# Patient Record
Sex: Female | Born: 1975
Health system: Southern US, Community
[De-identification: ages and names within clinical notes are randomized; demographics above are authoritative.]

## PROBLEM LIST (undated history)

## (undated) DIAGNOSIS — Z789 Other specified health status: Secondary | ICD-10-CM

## (undated) DIAGNOSIS — D649 Anemia, unspecified: Secondary | ICD-10-CM

## (undated) DIAGNOSIS — J302 Other seasonal allergic rhinitis: Secondary | ICD-10-CM

## (undated) HISTORY — DX: Anemia, unspecified: D64.9

## (undated) HISTORY — PX: NO PAST SURGERIES: SHX2092

## (undated) HISTORY — DX: Other specified health status: Z78.9

## (undated) HISTORY — DX: Other seasonal allergic rhinitis: J30.2

---

## 2018-12-14 ENCOUNTER — Ambulatory Visit: Payer: Self-pay | Admitting: Family Medicine

## 2018-12-17 ENCOUNTER — Ambulatory Visit: Payer: Self-pay | Admitting: Family Medicine

## 2019-01-18 ENCOUNTER — Encounter: Payer: Self-pay | Admitting: Family Medicine

## 2019-01-18 ENCOUNTER — Ambulatory Visit (INDEPENDENT_AMBULATORY_CARE_PROVIDER_SITE_OTHER): Payer: 59 | Admitting: Family Medicine

## 2019-01-18 ENCOUNTER — Other Ambulatory Visit: Payer: Self-pay

## 2019-01-18 DIAGNOSIS — S46819A Strain of other muscles, fascia and tendons at shoulder and upper arm level, unspecified arm, initial encounter: Secondary | ICD-10-CM

## 2019-01-18 NOTE — Progress Notes (Addendum)
Virtual Visit via Video Note  I connected with patient on 01/14/19 at  1:15 PM EDT by a video enabled telemedicine application and verified that I am speaking with the correct person using two identifiers.   I discussed the limitations of evaluation and management by telemedicine and the availability of in person appointments. The patient expressed understanding and agreed to proceed.  History of Present Illness: NP, [employer] insurance.  5 years ago for pregnancy. 05/2013, screened for HIV. Hx of gest DM, no issues following preg. Vanessa Daugherty is 43 yo.   +hx of FM or intermittent shoulder/neck pain. B/l, does yoga and uses heat. Not terrible right now. No numbness or tingling.   Observations/Objective: No conversational dyspnea Age appropriate judgment and insight Nml affect and mood   Assessment and Plan: Strain of trapezius muscle, unspecified laterality, initial encounter  Stretches/exercises, heat, Tylenol prn. Standing offer for PT.   Follow Up Instructions: At earliest convenience for CPE   I discussed the assessment and treatment plan with the patient. The patient was provided an opportunity to ask questions and all were answered. The patient agreed with the plan and demonstrated an understanding of the instructions.   The patient was advised to call back or seek an in-person evaluation if the symptoms worsen or if the condition fails to improve as anticipated.   [place], DO

## 2019-04-26 ENCOUNTER — Encounter: Payer: Self-pay | Admitting: Family Medicine

## 2019-05-31 ENCOUNTER — Other Ambulatory Visit: Payer: Self-pay

## 2019-06-01 ENCOUNTER — Ambulatory Visit (INDEPENDENT_AMBULATORY_CARE_PROVIDER_SITE_OTHER): Payer: 59 | Admitting: Family Medicine

## 2019-06-01 ENCOUNTER — Encounter: Payer: Self-pay | Admitting: Family Medicine

## 2019-06-01 VITALS — BP 102/68 | HR 73 | Temp 97.8°F | Ht 64.0 in | Wt 132.0 lb

## 2019-06-01 DIAGNOSIS — Z0000 Encounter for general adult medical examination without abnormal findings: Secondary | ICD-10-CM | POA: Diagnosis not present

## 2019-06-01 DIAGNOSIS — Z1239 Encounter for other screening for malignant neoplasm of breast: Secondary | ICD-10-CM

## 2019-06-01 LAB — LIPID PANEL
Cholesterol: 209 mg/dL — ABNORMAL HIGH (ref 0–200)
HDL: 45.9 mg/dL (ref >39.00)
LDL Cholesterol: 132 mg/dL — ABNORMAL HIGH (ref 0–99)
NonHDL: 163.26
Total CHOL/HDL Ratio: 5
Triglycerides: 157 mg/dL — ABNORMAL HIGH (ref 0.0–149.0)
VLDL: 31.4 mg/dL (ref 0.0–40.0)

## 2019-06-01 LAB — CBC
HCT: 37 % (ref 36.0–46.0)
Hemoglobin: 12.2 g/dL (ref 12.0–15.0)
MCHC: 33 g/dL (ref 30.0–36.0)
MCV: 88.8 fl (ref 78.0–100.0)
Platelets: 223 10*3/uL (ref 150.0–400.0)
RBC: 4.17 Mil/uL (ref 3.87–5.11)
RDW: 13.2 % (ref 11.5–15.5)
WBC: 5.7 10*3/uL (ref 4.0–10.5)

## 2019-06-01 LAB — COMPREHENSIVE METABOLIC PANEL
ALT: 27 U/L (ref 0–35)
AST: 25 U/L (ref 0–37)
Albumin: 4.6 g/dL (ref 3.5–5.2)
Alkaline Phosphatase: 56 U/L (ref 39–117)
BUN: 7 mg/dL (ref 6–23)
CO2: 28 mEq/L (ref 19–32)
Calcium: 9.5 mg/dL (ref 8.4–10.5)
Chloride: 103 mEq/L (ref 96–112)
Creatinine, Ser: 0.59 mg/dL (ref 0.40–1.20)
GFR: 111.6 mL/min (ref >60.00)
Glucose, Bld: 88 mg/dL (ref 70–99)
Potassium: 4.6 mEq/L (ref 3.5–5.1)
Sodium: 137 mEq/L (ref 135–145)
Total Bilirubin: 0.9 mg/dL (ref 0.2–1.2)
Total Protein: 7.2 g/dL (ref 6.0–8.3)

## 2019-06-01 LAB — TSH: TSH: 2.03 u[IU]/mL (ref 0.35–4.50)

## 2019-06-01 LAB — HEMOGLOBIN A1C: Hgb A1c MFr Bld: 5.6 % (ref 4.6–6.5)

## 2019-06-01 LAB — T4, FREE: Free T4: 0.85 ng/dL (ref 0.60–1.60)

## 2019-06-01 NOTE — Progress Notes (Signed)
Chief Complaint  Patient presents with  . Annual Exam     Well Woman Vanessa Daugherty is here for a complete physical.   Her last physical was >1 year ago.  Current diet: in general, a "good' diet. Current exercise: pushups, running, walking, cycling, yoga. Weight is stable and she denies daytime fatigue. Seatbelt? Yes  Health Maintenance Pap/HPV- Due  Mammogram- No Tetanus- Yes HIV screening- Yes  Past Medical History:  Diagnosis Date  . No known health problems      Past Surgical History:  Procedure Laterality Date  . NO PAST SURGERIES      Medications  Takes no meds routinely.    Allergies No Known Allergies  Review of Systems: Constitutional:  no unexpected weight changes Eye:  no recent significant change in vision Ear/Nose/Mouth/Throat:  Ears:  no tinnitus or vertigo and no recent change in hearing Nose/Mouth/Throat:  no complaints of nasal congestion, no sore throat Cardiovascular: no chest pain Respiratory:  no cough and no shortness of breath Gastrointestinal:  no abdominal pain, no change in bowel habits GU:  Female: negative for dysuria or pelvic pain Musculoskeletal/Extremities: +interminewttent neck pain; otherwise no pain of the joints Integumentary (Skin/Breast):  no abnormal skin lesions reported Neurologic:  no headaches Endocrine:  denies fatigue Hematologic/Lymphatic:  No areas of easy bleeding  Exam BP 102/68 (BP Location: Left Arm, Patient Position: Sitting, Cuff Size: Normal)   Pulse 73   Temp 97.8 F (36.6 C) (Temporal)   Ht 5\' 4"  (1.626 m)   Wt 132 lb (59.9 kg)   SpO2 98%   BMI 22.66 kg/m  General:  well developed, well nourished, in no apparent distress Skin:  no significant moles, warts, or growths Head:  no masses, lesions, or tenderness Eyes:  pupils equal and round, sclera anicteric without injection Ears:  canals without lesions, TMs shiny without retraction, no obvious effusion, no erythema Nose:  nares patent, septum  midline, mucosa normal, and no drainage or sinus tenderness Throat/Pharynx:  lips and gingiva without lesion; tongue and uvula midline; non-inflamed pharynx; no exudates or postnasal drainage Neck: neck supple without adenopathy, thyromegaly, or masses Lungs:  clear to auscultation, breath sounds equal bilaterally, no respiratory distress Cardio:  regular rate and rhythm, no bruits, no LE edema Abdomen:  abdomen soft, nontender; bowel sounds normal; no masses or organomegaly Genital: Defer to GYN Musculoskeletal: +ttp over b/l traps; otherwise symmetrical muscle groups noted without atrophy or deformity Extremities:  no clubbing, cyanosis, or edema, no deformities, no skin discoloration Neuro:  gait normal; deep tendon reflexes normal and symmetric Psych: well oriented with normal range of affect and appropriate judgment/insight  Assessment and Plan  Well adult exam - Plan: CBC, Comprehensive metabolic panel, Lipid panel, TSH, T4, free, Hemoglobin A1c  Breast cancer screening - Plan: MM DIGITAL SCREENING BILATERAL   Well 43 y.o. female. Counseled on diet and exercise. Other orders as above. Follow up in 1 yr or prn. The patient voiced understanding and agreement to the plan.  [place], DO 06/01/19 2:07 PM

## 2019-06-01 NOTE — Patient Instructions (Addendum)
Give [place] 2-3 business days to get the results of your labs back.   Keep the diet clean and stay active.  Call [facility] [place] at [facility] at [phone] for an appointment.  They are located at [address], [place], [place], [address] (right across the hall from our office).  I recommend getting the flu shot in mid October. This suggestion would change if the CDC comes out with a different recommendation.   Let us know if you need anything.  [email or web address]  Trapezius stretches/exercises Do exercises exactly as told by your health care provider and adjust them as directed. It is normal to feel mild stretching, pulling, tightness, or discomfort as you do these exercises, but you should stop right away if you feel sudden pain or your pain gets worse.  Stretching and range of motion exercises These exercises warm up your muscles and joints and improve the movement and flexibility of your shoulder. These exercises can also help to relieve pain, numbness, and tingling. If you are unable to do any of the following for any reason, do not further attempt to do it.   Exercise A: Flexion, standing    1. Stand and hold a broomstick, a cane, or a similar object. Place your hands a little more than shoulder-width apart on the object. Your left / right hand should be palm-up, and your other hand should be palm-down. 2. Push the stick to raise your left / right arm out to your side and then over your head. Use your other hand to help move the stick. Stop when you feel a stretch in your shoulder, or when you reach the angle that is recommended by your health care provider. ? Avoid shrugging your shoulder while you raise your arm. Keep your shoulder blade tucked down toward your spine. 3. Hold for 30 seconds. 4. Slowly return to the starting position. Repeat 2 times. Complete this exercise 3 times per week.  Exercise B: Abduction, supine     1. Lie on your back and hold a broomstick, a cane, or a similar object. Place your hands a little more than shoulder-width apart on the object. Your left / right hand should be palm-up, and your other hand should be palm-down. 2. Push the stick to raise your left / right arm out to your side and then over your head. Use your other hand to help move the stick. Stop when you feel a stretch in your shoulder, or when you reach the angle that is recommended by your health care provider. ? Avoid shrugging your shoulder while you raise your arm. Keep your shoulder blade tucked down toward your spine. 3. Hold for 30 seconds. 4. Slowly return to the starting position. Repeat 2 times. Complete this exercise 3 times per week.  Exercise C: Flexion, active-assisted    1. Lie on your back. You may bend your knees for comfort. 2. Hold a broomstick, a cane, or a similar object. Place your hands about shoulder-width apart on the object. Your palms should face toward your feet. 3. Raise the stick and move your arms over your head and behind your head, toward the floor. Use your healthy arm to help your left / right arm move farther. Stop when you feel a gentle stretch in your shoulder, or when you reach the angle where your health care provider tells you to stop. 4. Hold for 30 seconds. 5. Slowly return to the starting position. Repeat 2 times.  Complete this exercise 3 times per week.  Exercise D: External rotation and abduction    1. Stand in a door frame with one of your feet slightly in front of the other. This is called a staggered stance. 2. Choose one of the following positions as told by your health care provider: ? Place your hands and forearms on the door frame above your head. ? Place your hands and forearms on the door frame at the height of your head. ? Place your hands on the door frame at the height of your elbows. 3. Slowly move your weight onto your front foot until you feel a stretch  across your chest and in the front of your shoulders. Keep your head and chest upright and keep your abdominal muscles tight. 4. Hold for 30 seconds. 5. To release the stretch, shift your weight to your back foot. Repeat 2 times. Complete this stretch 3 times per week.  Strengthening exercises These exercises build strength and endurance in your shoulder. Endurance is the ability to use your muscles for a long time, even after your muscles get tired. Exercise E: Scapular depression and adduction  1. Sit on a stable chair. Support your arms in front of you with pillows, armrests, or a tabletop. Keep your elbows in line with the sides of your body. 2. Gently move your shoulder blades down toward your middle back. Relax the muscles on the tops of your shoulders and in the back of your neck. 3. Hold for 3 seconds. 4. Slowly release the tension and relax your muscles completely before doing this exercise again. Repeat for a total of 10 repetitions. 5. After you have practiced this exercise, try doing the exercise without the arm support. Then, try the exercise while standing instead of sitting. Repeat 2 times. Complete this exercise 3 times per week.  Exercise F: Shoulder abduction, isometric    1. Stand or sit about 4-6 inches (10-15 cm) from a wall with your left / right side facing the wall. 2. Bend your left / right elbow and gently press your elbow against the wall. 3. Increase the pressure slowly until you are pressing as hard as you can without shrugging your shoulder. 4. Hold for 3 seconds. 5. Slowly release the tension and relax your muscles completely. Repeat for a total of 10 repetitions. Repeat 2 times. Complete this exercise 3 times per week.  Exercise G: Shoulder flexion, isometric    1. Stand or sit about 4-6 inches (10-15 cm) away from a wall with your left / right side facing the wall. 2. Keep your left / right elbow straight and gently press the top of your fist against  the wall. Increase the pressure slowly until you are pressing as hard as you can without shrugging your shoulder. 3. Hold for 10-15 seconds. 4. Slowly release the tension and relax your muscles completely. Repeat for a total of 10 repetitions. Repeat 2 times. Complete this exercise 3 times per week.  Exercise H: Internal rotation    1. Sit in a stable chair without armrests, or stand. Secure an exercise band at your left / right side, at elbow height. 2. Place a soft object, such as a folded towel or a small pillow, under your left / right upper arm so your elbow is a few inches (about 8 cm) away from your side. 3. Hold the end of the exercise band so the band stretches. 4. Keeping your elbow pressed against the soft object under your  arm, move your forearm across your body toward your abdomen. Keep your body steady so the movement is only coming from your shoulder. 5. Hold for 3 seconds. 6. Slowly return to the starting position. Repeat for a total of 10 repetitions. Repeat 2 times. Complete this exercise 3 times per week.  Exercise I: External rotation    1. Sit in a stable chair without armrests, or stand. 2. Secure an exercise band at your left / right side, at elbow height. 3. Place a soft object, such as a folded towel or a small pillow, under your left / right upper arm so your elbow is a few inches (about 8 cm) away from your side. 4. Hold the end of the exercise band so the band stretches. 5. Keeping your elbow pressed against the soft object under your arm, move your forearm out, away from your abdomen. Keep your body steady so the movement is only coming from your shoulder. 6. Hold for 3 seconds. 7. Slowly return to the starting position. Repeat for a total of 10 repetitions. Repeat 2 times. Complete this exercise 3 times per week. Exercise J: Shoulder extension  1. Sit in a stable chair without armrests, or stand. Secure an exercise band to a stable object in front of you so  the band is at shoulder height. 2. Hold one end of the exercise band in each hand. Your palms should face each other. 3. Straighten your elbows and lift your hands up to shoulder height. 4. Step back, away from the secured end of the exercise band, until the band stretches. 5. Squeeze your shoulder blades together and pull your hands down to the sides of your thighs. Stop when your hands are straight down by your sides. Do not let your hands go behind your body. 6. Hold for 3 seconds. 7. Slowly return to the starting position. Repeat for a total of 10 repetitions. Repeat 2 times. Complete this exercise 3 times per week.  Exercise K: Shoulder extension, prone    1. Lie on your abdomen on a firm surface so your left / right arm hangs over the edge. 2. Hold a 5 lb weight in your hand so your palm faces in toward your body. Your arm should be straight. 3. Squeeze your shoulder blade down toward the middle of your back. 4. Slowly raise your arm behind you, up to the height of the surface that you are lying on. Keep your arm straight. 5. Hold for 3 seconds. 6. Slowly return to the starting position and relax your muscles. Repeat for a total of 10 repetitions. Repeat 2 times. Complete this exercise 3 times per week.   Exercise L: Horizontal abduction, prone  1. Lie on your abdomen on a firm surface so your left / right arm hangs over the edge. 2. Hold a 5 lb weight in your hand so your palm faces toward your feet. Your arm should be straight. 3. Squeeze your shoulder blade down toward the middle of your back. 4. Bend your elbow so your hand moves up, until your elbow is bent to an "L" shape (90 degrees). With your elbow bent, slowly move your forearm forward and up. Raise your hand up to the height of the surface that you are lying on. ? Your upper arm should not move, and your elbow should stay bent. ? At the top of the movement, your palm should face the floor. 5. Hold for 3  seconds. 6. Slowly return to the starting  position and relax your muscles. Repeat for a total of 10 repetitions. Repeat 2 times. Complete this exercise 3 times per week.  Exercise M: Horizontal abduction, standing  1. Sit on a stable chair, or stand. 2. Secure an exercise band to a stable object in front of you so the band is at shoulder height. 3. Hold one end of the exercise band in each hand. 4. Straighten your elbows and lift your hands straight in front of you, up to shoulder height. Your palms should face down, toward the floor. 5. Step back, away from the secured end of the exercise band, until the band stretches. 6. Move your arms out to your sides, and keep your arms straight. 7. Hold for 3 seconds. 8. Slowly return to the starting position. Repeat for a total of 10 repetitions. Repeat 2 times. Complete this exercise 3 times per week.  Exercise N: Scapular retraction and elevation  1. Sit on a stable chair, or stand. 2. Secure an exercise band to a stable object in front of you so the band is at shoulder height. 3. Hold one end of the exercise band in each hand. Your palms should face each other. 4. Sit in a stable chair without armrests, or stand. 5. Step back, away from the secured end of the exercise band, until the band stretches. 6. Squeeze your shoulder blades together and lift your hands over your head. Keep your elbows straight. 7. Hold for 3 seconds. 8. Slowly return to the starting position. Repeat for a total of 10 repetitions. Repeat 2 times. Complete this exercise 3 times per week.  This information is not intended to replace advice given to you by your health care provider. Make sure you discuss any questions you have with your health care provider. Document Released: 09/23/2005 Document Revised: 05/30/2016 Document Reviewed: 08/10/2015 Elsevier Interactive Patient Education  2017 [employer].

## 2019-06-07 ENCOUNTER — Other Ambulatory Visit: Payer: Self-pay

## 2019-06-07 ENCOUNTER — Other Ambulatory Visit: Payer: Self-pay | Admitting: Family Medicine

## 2019-06-07 DIAGNOSIS — R6889 Other general symptoms and signs: Secondary | ICD-10-CM | POA: Diagnosis not present

## 2019-06-07 DIAGNOSIS — J029 Acute pharyngitis, unspecified: Secondary | ICD-10-CM

## 2019-06-07 DIAGNOSIS — Z20822 Contact with and (suspected) exposure to covid-19: Secondary | ICD-10-CM

## 2019-06-09 LAB — NOVEL CORONAVIRUS, NAA: SARS-CoV-2, NAA: NOT DETECTED

## 2019-12-13 ENCOUNTER — Ambulatory Visit: Payer: 59 | Admitting: Obstetrics & Gynecology

## 2020-01-12 ENCOUNTER — Ambulatory Visit (INDEPENDENT_AMBULATORY_CARE_PROVIDER_SITE_OTHER): Payer: 59 | Admitting: Obstetrics & Gynecology

## 2020-01-12 ENCOUNTER — Encounter: Payer: Self-pay | Admitting: Obstetrics & Gynecology

## 2020-01-12 ENCOUNTER — Other Ambulatory Visit: Payer: Self-pay

## 2020-01-12 ENCOUNTER — Other Ambulatory Visit (HOSPITAL_COMMUNITY)
Admission: RE | Admit: 2020-01-12 | Discharge: 2020-01-12 | Disposition: A | Discharge status: Home / Self Care | Payer: 59 | Source: Ambulatory Visit | Attending: Obstetrics & Gynecology | Admitting: Obstetrics & Gynecology

## 2020-01-12 ENCOUNTER — Other Ambulatory Visit: Payer: Self-pay | Admitting: Obstetrics & Gynecology

## 2020-01-12 ENCOUNTER — Ambulatory Visit (HOSPITAL_BASED_OUTPATIENT_CLINIC_OR_DEPARTMENT_OTHER)
Admission: RE | Admit: 2020-01-12 | Discharge: 2020-01-12 | Disposition: A | Discharge status: Home / Self Care | Payer: 59 | Source: Ambulatory Visit | Attending: Obstetrics & Gynecology | Admitting: Obstetrics & Gynecology

## 2020-01-12 VITALS — BP 110/64 | HR 78 | Ht 64.0 in | Wt 133.1 lb

## 2020-01-12 DIAGNOSIS — N393 Stress incontinence (female) (male): Secondary | ICD-10-CM

## 2020-01-12 DIAGNOSIS — Z01419 Encounter for gynecological examination (general) (routine) without abnormal findings: Secondary | ICD-10-CM | POA: Insufficient documentation

## 2020-01-12 DIAGNOSIS — N814 Uterovaginal prolapse, unspecified: Secondary | ICD-10-CM

## 2020-01-12 DIAGNOSIS — Z1231 Encounter for screening mammogram for malignant neoplasm of breast: Secondary | ICD-10-CM | POA: Insufficient documentation

## 2020-01-12 NOTE — Progress Notes (Signed)
Subjective:     Vanessa Daugherty is a 44 y.o. female here for a routine exam.G2P2 s/p SVD x 2 of 7-8# babies. LMP mid Mar. Reports cycles monthly. No issues. Married and sexually active. Condoms for contraception. Reports occ incontinence with laughing. Did Kegels immediately after delivery but, only briefly.     Gynecologic History Patient's last menstrual period was 12/20/2019. Contraception: condoms Last Pap: 2016. Results were: normal Last mammogram: never had  Obstetric History OB History  Gravida Para Term Preterm AB Living  2 2 2     2   SAB TAB Ectopic Multiple Live Births          2    # Outcome Date GA Lbr Len/2nd Weight Sex Delivery Anes PTL Lv  2 Term 2014 [redacted]w[redacted]d   F Vag-Spont EPI N LIV  1 Term 2011 [redacted]w[redacted]d   F Vag-Spont EPI N LIV   The following portions of the patient's history were reviewed and updated as appropriate: allergies, current medications, past family history, past medical history, past social history, past surgical history and problem list.  Review of Systems Pertinent items are noted in HPI.    Objective:  BP 110/64   Pulse 78   Ht 5\' 4"  (1.626 m)   Wt 133 lb 1.3 oz (60.4 kg)   LMP 12/20/2019   BMI 22.84 kg/m   General Appearance:    Alert, cooperative, no distress, appears stated age  Head:    Normocephalic, without obvious abnormality, atraumatic  Eyes:    conjunctiva/corneas clear, EOM's intact, both eyes  Ears:    Normal external ear canals, both ears  Nose:   Nares normal, septum midline, mucosa normal, no drainage    or sinus tenderness  Throat:   Lips, mucosa, and tongue normal; teeth and gums normal  Neck:   Supple, symmetrical, trachea midline, no adenopathy;    thyroid:  no enlargement/tenderness/nodules  Back:     Symmetric, no curvature, ROM normal, no CVA tenderness  Lungs:     respirations unlabored  Chest Wall:    No tenderness or deformity   Heart:    Regular rate and rhythm  Breast Exam:    No tenderness, masses, or nipple  abnormality  Abdomen:     Soft, non-tender, bowel sounds active all four quadrants,    no masses, no organomegaly  Genitalia:    Normal female without lesion, discharge or tenderness   Grade I uterine prolapse with mild midline cystocele.   Extremities:   Extremities normal, atraumatic, no cyanosis or edema  Pulses:   2+ and symmetric all extremities  Skin:   Skin color, texture, turgor normal, no rashes or lesions    Assessment:    Healthy female exam.   Mild uterine prolapse and cystocele- no treatment needed at present. .  Occ stress urinary incontinence.    Plan:   F/u PAP with rHPV screening mammogram  Kegel's exercises F/u in 1 year or sooner prn  Antonios Ostrow L. Harraway-Smith, M.D., Vanessa Daugherty

## 2020-01-12 NOTE — Patient Instructions (Signed)

## 2020-01-13 LAB — CYTOLOGY - PAP
Comment: NEGATIVE
Diagnosis: NEGATIVE
High risk HPV: NEGATIVE

## 2020-05-30 ENCOUNTER — Ambulatory Visit: Payer: 59 | Attending: Internal Medicine

## 2020-05-30 DIAGNOSIS — Z23 Encounter for immunization: Secondary | ICD-10-CM

## 2020-05-30 NOTE — Progress Notes (Signed)
   Covid-19 Vaccination Clinic  Name:  Vanessa Daugherty    MRN: [id number] DOB: 1976/01/04  05/30/2020  Vanessa Daugherty was observed post Covid-19 immunization for 15 minutes without incident. She was provided with Vaccine Information Sheet and instruction to access the V-Safe system.   Vanessa Daugherty was instructed to call 911 with any severe reactions post vaccine: Marland Kitchen Difficulty breathing  . Swelling of face and throat  . A fast heartbeat  . A bad rash all over body  . Dizziness and weakness

## 2020-06-06 ENCOUNTER — Encounter: Payer: 59 | Admitting: Family Medicine

## 2020-06-09 ENCOUNTER — Ambulatory Visit (INDEPENDENT_AMBULATORY_CARE_PROVIDER_SITE_OTHER): Payer: 59 | Admitting: Family Medicine

## 2020-06-09 ENCOUNTER — Other Ambulatory Visit: Payer: Self-pay

## 2020-06-09 ENCOUNTER — Encounter: Payer: Self-pay | Admitting: Family Medicine

## 2020-06-09 ENCOUNTER — Other Ambulatory Visit: Payer: Self-pay | Admitting: Family Medicine

## 2020-06-09 VITALS — BP 104/62 | HR 65 | Temp 98.1°F | Ht 64.0 in | Wt 138.0 lb

## 2020-06-09 DIAGNOSIS — Z0000 Encounter for general adult medical examination without abnormal findings: Secondary | ICD-10-CM

## 2020-06-09 DIAGNOSIS — Z1159 Encounter for screening for other viral diseases: Secondary | ICD-10-CM

## 2020-06-09 MED ORDER — POLYSACCHARIDE IRON COMPLEX 150 MG PO CAPS: 150.0000 mg | ORAL_CAPSULE | Freq: Every day | ORAL | 5 refills | Status: DC

## 2020-06-09 MED ORDER — PANTOPRAZOLE SODIUM 40 MG PO TBEC: 40.0000 mg | DELAYED_RELEASE_TABLET | Freq: Every day | ORAL | 1 refills | Status: DC

## 2020-06-09 MED ORDER — KETOROLAC TROMETHAMINE 10 MG PO TABS: 10.0000 mg | ORAL_TABLET | Freq: Four times a day (QID) | ORAL | 2 refills | Status: DC | PRN

## 2020-06-09 MED FILL — FERREX 150 CAPSULE: 150 | 30 days supply | Qty: 30 | Fill #0

## 2020-06-09 MED FILL — PANTOPRAZOLE SOD DR 40 MG T: 40 | 60 days supply | Qty: 60 | Fill #0

## 2020-06-09 MED FILL — KETOROLAC 10 MG TABLET: 10 | 5 days supply | Qty: 20 | Fill #0

## 2020-06-09 NOTE — Progress Notes (Signed)
Chief Complaint  Patient presents with  . Annual Exam  . Abdominal Pain     Well Woman Vanessa Daugherty is here for a complete physical.   Her last physical was >1 year ago.  Current diet: in general, diet could be better. Current exercise: nothing routine. Weight is up 5 lbs and she denies fatigue out of ordinary. Seatbelt? Yes   Health Maintenance Pap/HPV- Yes Mammogram- Yes Tetanus- Yes Hep C screening- No HIV screening- Yes  Past Medical History:  Diagnosis Date  . No known health problems      Past Surgical History:  Procedure Laterality Date  . NO PAST SURGERIES      Medications  Takes no meds routinely.    Allergies No Known Allergies  Review of Systems: Constitutional:  no unexpected weight changes Eye:  no recent significant change in vision Ear/Nose/Mouth/Throat:  Ears:  no recent change in hearing Nose/Mouth/Throat:  no complaints of nasal congestion, no sore throat Cardiovascular: no chest pain Respiratory:  no shortness of breath Gastrointestinal:  +reflux GU:  Female: negative for dysuria or pelvic pain Musculoskeletal/Extremities:  No new pain of the joints Integumentary (Skin/Breast):  no abnormal skin lesions reported Neurologic:  no headaches Endocrine:  denies fatigue Hematologic/Lymphatic:  No areas of easy bleeding  Exam BP 104/62 (BP Location: Left Arm, Patient Position: Sitting, Cuff Size: Normal)   Pulse 65   Temp 98.1 F (36.7 C) (Oral)   Ht 5\' 4"  (1.626 m)   Wt 138 lb (62.6 kg)   SpO2 99%   BMI 23.69 kg/m  General:  well developed, well nourished, in no apparent distress Skin:  no significant moles, warts, or growths Head:  no masses, lesions, or tenderness Eyes:  pupils equal and round, sclera anicteric without injection Ears:  canals without lesions, TMs shiny without retraction, no obvious effusion, no erythema Nose:  nares patent, septum midline, mucosa normal, and no drainage or sinus tenderness Throat/Pharynx:  lips  and gingiva without lesion; tongue and uvula midline; non-inflamed pharynx; no exudates or postnasal drainage Neck: neck supple without adenopathy, thyromegaly, or masses Lungs:  clear to auscultation, breath sounds equal bilaterally, no respiratory distress Cardio:  regular rate and rhythm, no LE edema Abdomen:  abdomen soft, nontender; bowel sounds normal; no masses or organomegaly Genital: Defer to GYN Musculoskeletal: +ttp over b/l traps; symmetrical muscle groups noted without atrophy or deformity Extremities:  no clubbing, cyanosis, or edema, no deformities, no skin discoloration Neuro:  gait normal; deep tendon reflexes normal and symmetric Psych: well oriented with normal range of affect and appropriate judgment/insight  Assessment and Plan  Well adult exam - Plan: CBC, Comprehensive metabolic panel, TSH, T4, free, Lipid panel, IBC + Ferritin, Hemoglobin A1c, VITAMIN D 25 Hydroxy (Vit-D Deficiency, Fractures)  Encounter for hepatitis C screening test for low risk patient - Plan: Hepatitis C antibody   Well 44 y.o. female. Counseled on diet and exercise. Other orders as above. 60 d ppi trial. Consider cont it or H2 blocker afterwards. Hopefully she can stop. She probably has FM. Discussed medication (has been on a few), but she would like to avoid for now. Follow up in 1 yr or prn. The patient voiced understanding and agreement to the plan.  [place], DO 06/09/20 2:20 PM

## 2020-06-09 NOTE — Patient Instructions (Addendum)
Give [place] 2-3 business days to get the results of your labs back.   Keep the diet clean and stay active.  The only lifestyle changes that have data behind them are weight loss for the overweight/obese and elevating the head of the bed. Finding out which foods/positions are triggers is important.  Do those stretches and exercises.   Heat (pad or rice pillow in microwave) over affected area, 10-15 minutes twice daily.   Let me know if you would like to start a medicine, let me know.  Consider yoga, tai chi, weight resistance exercise.  Let us know if you need anything.

## 2020-06-10 ENCOUNTER — Other Ambulatory Visit: Payer: Self-pay | Admitting: Family Medicine

## 2020-06-10 LAB — IRON,TIBC AND FERRITIN PANEL
%SAT: 20 % (calc) (ref 16–45)
Ferritin: 18 ng/mL (ref 16–232)
Iron: 83 ug/dL (ref 40–190)
TIBC: 408 mcg/dL (calc) (ref 250–450)

## 2020-06-10 MED ORDER — VITAMIN D (ERGOCALCIFEROL) 1.25 MG (50000 UNIT) PO CAPS: 50000.0000 [IU] | ORAL_CAPSULE | ORAL | 0 refills | Status: DC

## 2020-06-13 ENCOUNTER — Other Ambulatory Visit: Payer: Self-pay | Admitting: Family Medicine

## 2020-06-13 DIAGNOSIS — E559 Vitamin D deficiency, unspecified: Secondary | ICD-10-CM

## 2020-06-13 DIAGNOSIS — E785 Hyperlipidemia, unspecified: Secondary | ICD-10-CM

## 2020-06-13 LAB — COMPREHENSIVE METABOLIC PANEL
AG Ratio: 1.5 (calc) (ref 1.0–2.5)
ALT: 17 U/L (ref 6–29)
AST: 17 U/L (ref 10–30)
Albumin: 4.4 g/dL (ref 3.6–5.1)
Alkaline phosphatase (APISO): 57 U/L (ref 31–125)
BUN/Creatinine Ratio: 10 (calc) (ref 6–22)
BUN: 6 mg/dL — ABNORMAL LOW (ref 7–25)
CO2: 26 mmol/L (ref 20–32)
Calcium: 9.5 mg/dL (ref 8.6–10.2)
Chloride: 103 mmol/L (ref 98–110)
Creat: 0.61 mg/dL (ref 0.50–1.10)
Globulin: 3 g/dL (calc) (ref 1.9–3.7)
Glucose, Bld: 74 mg/dL (ref 65–99)
Potassium: 4.1 mmol/L (ref 3.5–5.3)
Sodium: 137 mmol/L (ref 135–146)
Total Bilirubin: 0.9 mg/dL (ref 0.2–1.2)
Total Protein: 7.4 g/dL (ref 6.1–8.1)

## 2020-06-13 LAB — HEPATITIS C ANTIBODY
Hepatitis C Ab: NONREACTIVE
SIGNAL TO CUT-OFF: 0.01 (ref <1.00)

## 2020-06-13 LAB — VITAMIN D 25 HYDROXY (VIT D DEFICIENCY, FRACTURES): Vit D, 25-Hydroxy: 15 ng/mL — ABNORMAL LOW (ref 30–100)

## 2020-06-13 LAB — HEMOGLOBIN A1C
Hgb A1c MFr Bld: 5.3 % of total Hgb (ref <5.7)
Mean Plasma Glucose: 105 (calc)
eAG (mmol/L): 5.8 (calc)

## 2020-06-13 LAB — CBC
HCT: 37.8 % (ref 35.0–45.0)
Hemoglobin: 12.3 g/dL (ref 11.7–15.5)
MCH: 29.3 pg (ref 27.0–33.0)
MCHC: 32.5 g/dL (ref 32.0–36.0)
MCV: 90 fL (ref 80.0–100.0)
MPV: 10.9 fL (ref 7.5–12.5)
Platelets: 253 10*3/uL (ref 140–400)
RBC: 4.2 10*6/uL (ref 3.80–5.10)
RDW: 13.4 % (ref 11.0–15.0)
WBC: 6.4 10*3/uL (ref 3.8–10.8)

## 2020-06-13 LAB — LIPID PANEL
Cholesterol: 206 mg/dL — ABNORMAL HIGH (ref <200)
HDL: 54 mg/dL (ref >=50)
LDL Cholesterol (Calc): 120 mg/dL (calc) — ABNORMAL HIGH
Non-HDL Cholesterol (Calc): 152 mg/dL (calc) — ABNORMAL HIGH (ref <130)
Total CHOL/HDL Ratio: 3.8 (calc) (ref <5.0)
Triglycerides: 207 mg/dL — ABNORMAL HIGH (ref <150)

## 2020-06-13 LAB — TSH: TSH: 1.58 mIU/L

## 2020-06-13 LAB — T4, FREE: Free T4: 1.3 ng/dL (ref 0.8–1.8)

## 2020-06-13 MED FILL — VIT D2 1.25 MG (50,000 UNIT: 1.25 MG | 84 days supply | Qty: 12 | Fill #0

## 2020-07-05 MED FILL — FLUARIX QUADRIVALENT 0.5 ML: 0.5 | 1 days supply | Qty: 1 | Fill #0

## 2020-08-15 MED FILL — FERREX 150 CAPSULE: 150 | 30 days supply | Qty: 30 | Fill #1

## 2020-08-27 IMAGING — MG DIGITAL SCREENING BILAT W/ TOMO W/ CAD
7 series · 9 of 23 positions shown · non-contrast
Comparison: None.

CLINICAL DATA: Screening.

EXAM:
DIGITAL SCREENING BILATERAL MAMMOGRAM WITH TOMO AND CAD

[R CC synth-2D]
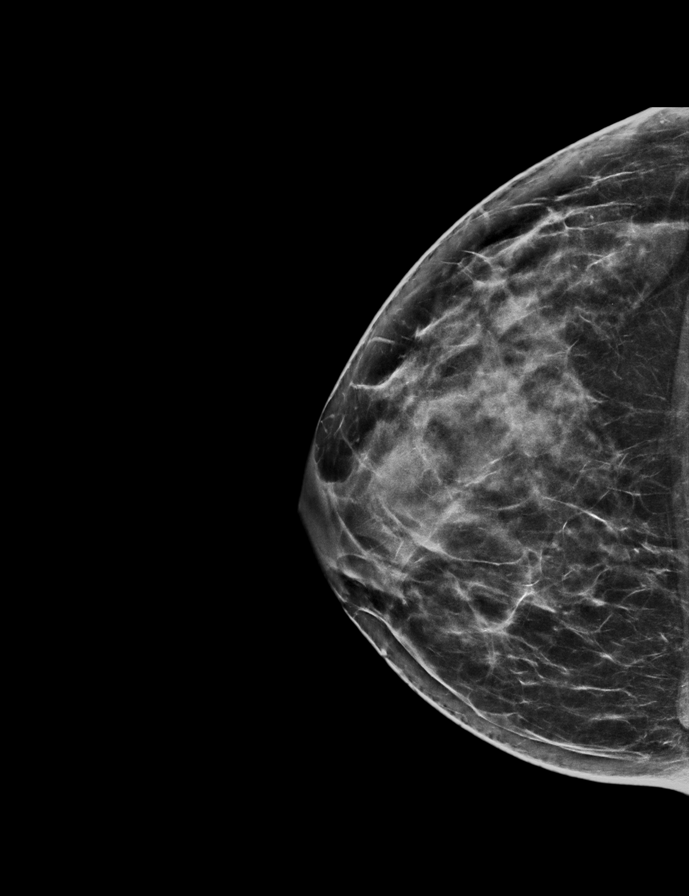

[L MLO synth-2D]
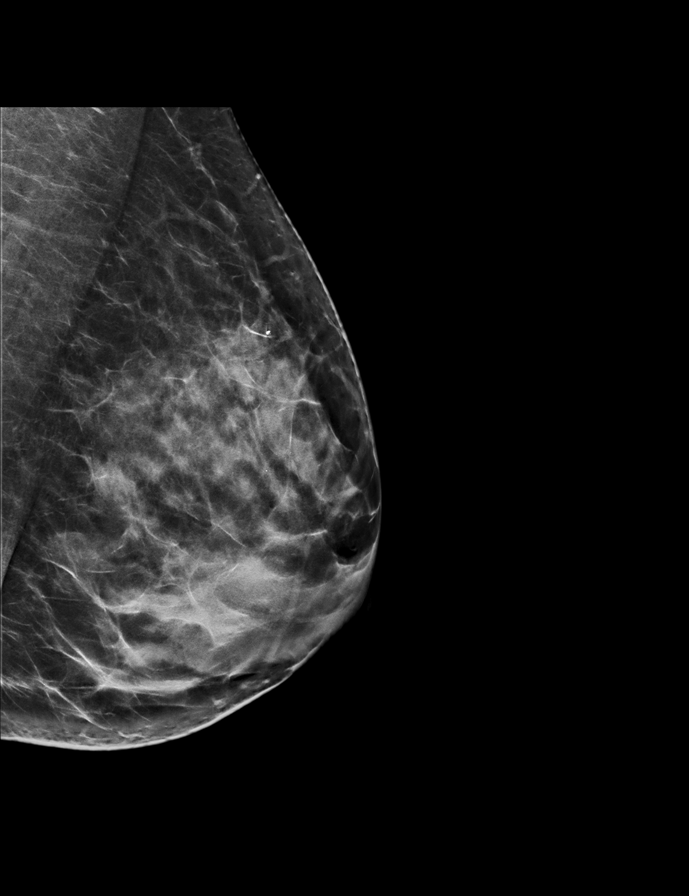

[L CC synth-2D]
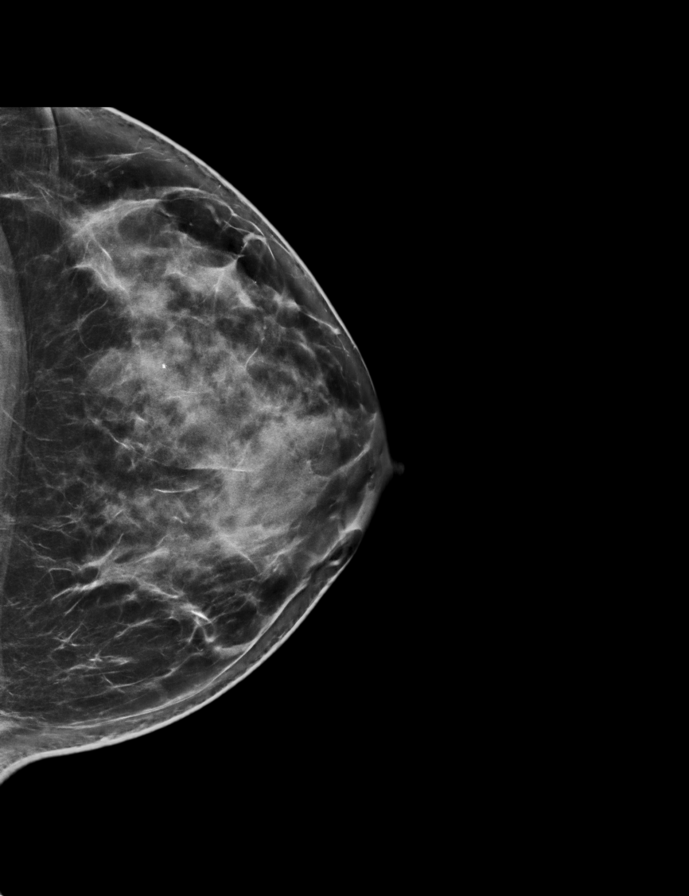

[L MLO tomo · 3 of 68 frames shown]
[frame 22/68]
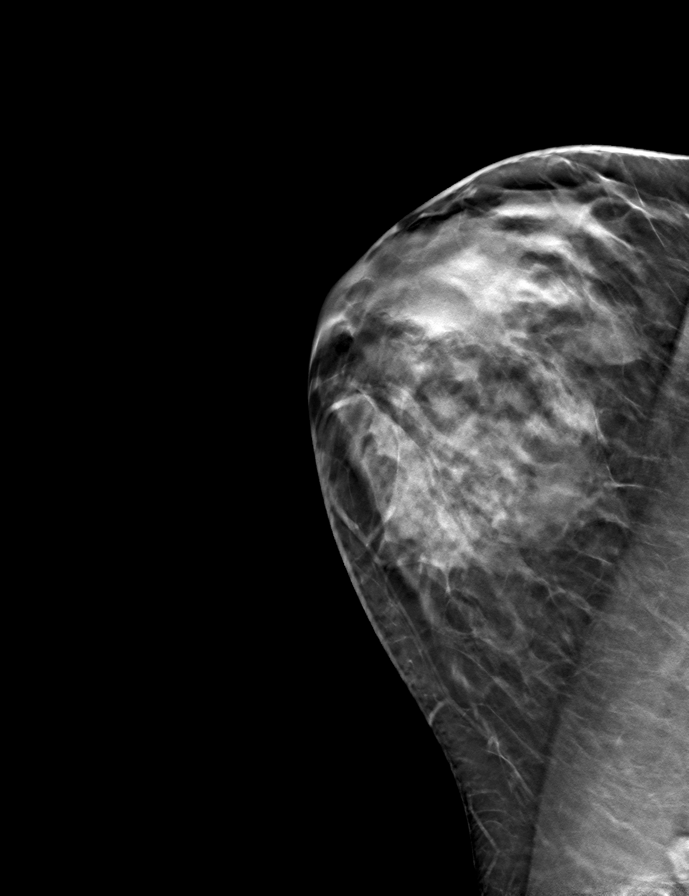
[frame 35/68]
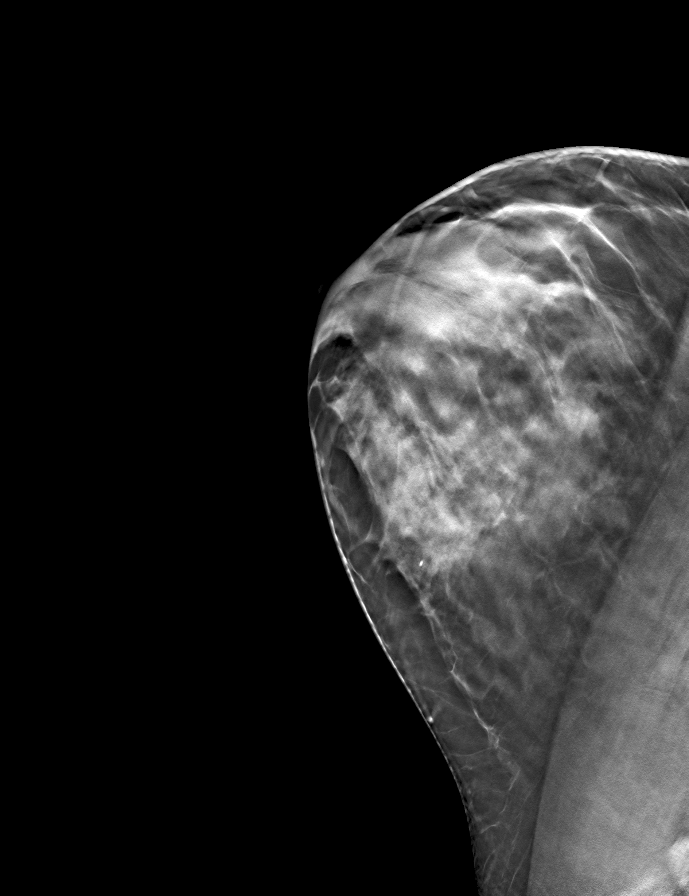
[frame 47/68]
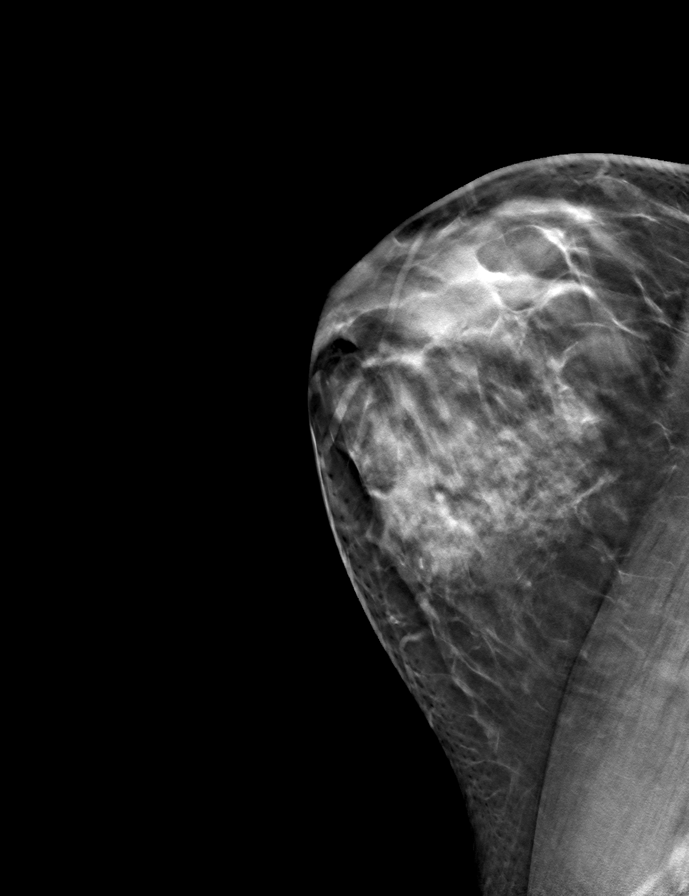

[L CC tomo · tomo slice 35/69.0]
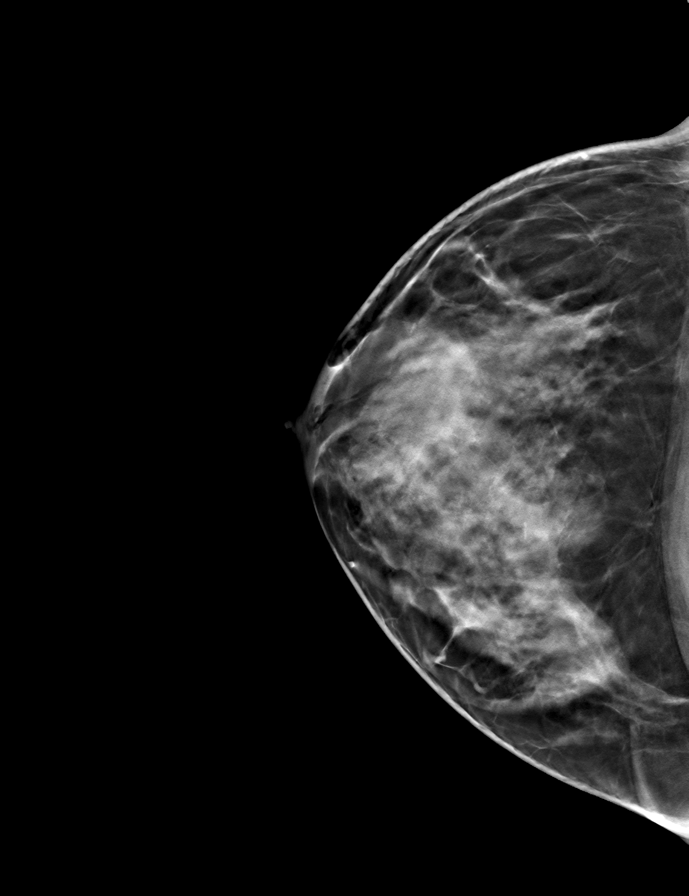

[R CC tomo · tomo slice 33/66.0]
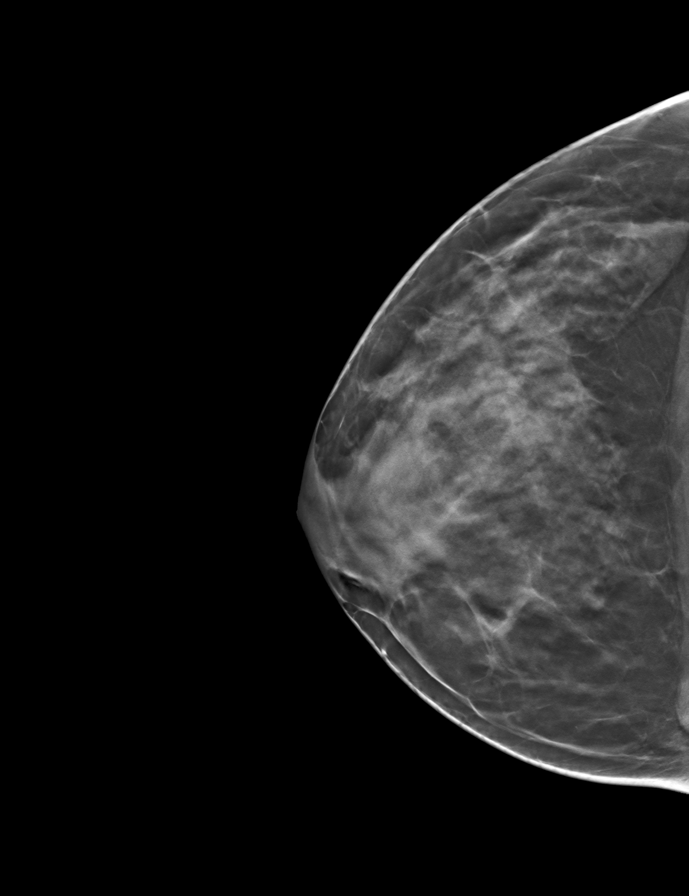

[R MLO tomo · tomo slice 33/65.0]
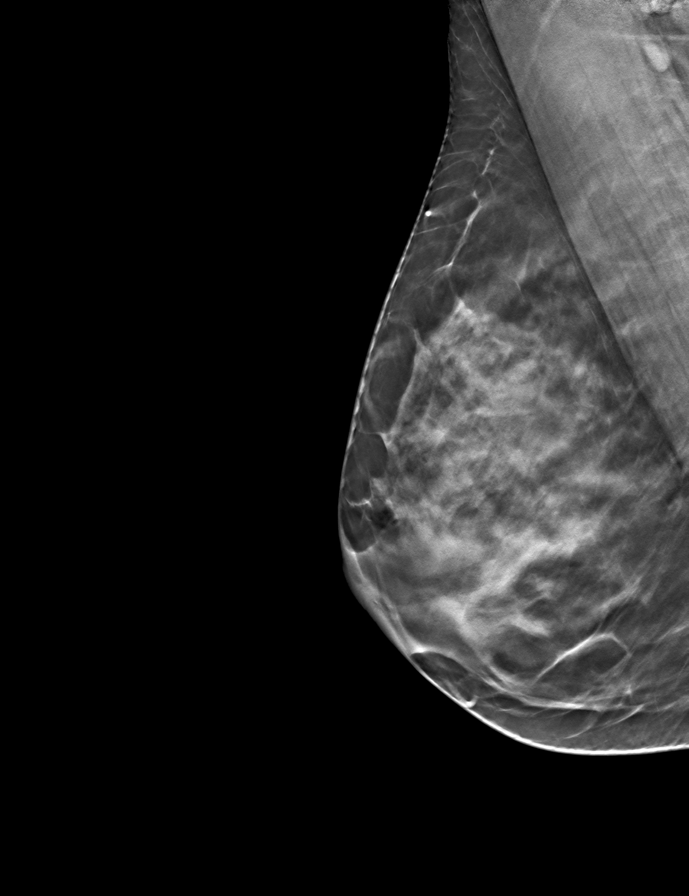

[9 of 23 positions shown; findings below may reference images not displayed]

ACR Breast Density Category d: The breast tissue is extremely dense,
which lowers the sensitivity of mammography.
FINDINGS: There are no findings suspicious for malignancy. Images were
processed with CAD.
IMPRESSION: No mammographic evidence of malignancy. A result letter of this
screening mammogram will be mailed directly to the patient.

RECOMMENDATION:
Screening mammogram in one year. (Code:F8-6-TBV)

BI-RADS CATEGORY  1: Negative.

## 2020-09-19 ENCOUNTER — Other Ambulatory Visit: Payer: Self-pay

## 2020-09-19 ENCOUNTER — Other Ambulatory Visit (INDEPENDENT_AMBULATORY_CARE_PROVIDER_SITE_OTHER): Payer: 59

## 2020-09-19 ENCOUNTER — Other Ambulatory Visit: Payer: Self-pay | Admitting: Family Medicine

## 2020-09-19 DIAGNOSIS — E785 Hyperlipidemia, unspecified: Secondary | ICD-10-CM | POA: Diagnosis not present

## 2020-09-19 DIAGNOSIS — R790 Abnormal level of blood mineral: Secondary | ICD-10-CM | POA: Diagnosis not present

## 2020-09-19 DIAGNOSIS — E559 Vitamin D deficiency, unspecified: Secondary | ICD-10-CM

## 2020-09-19 LAB — LIPID PANEL
Cholesterol: 203 mg/dL — ABNORMAL HIGH (ref 0–200)
HDL: 41.2 mg/dL (ref >39.00)
NonHDL: 161.63
Total CHOL/HDL Ratio: 5
Triglycerides: 224 mg/dL — ABNORMAL HIGH (ref 0.0–149.0)
VLDL: 44.8 mg/dL — ABNORMAL HIGH (ref 0.0–40.0)

## 2020-09-19 LAB — LDL CHOLESTEROL, DIRECT: Direct LDL: 130 mg/dL

## 2020-09-19 LAB — IBC + FERRITIN
Iron: 71 ug/dL (ref 42–145)
Saturation Ratios: 17.9 % — ABNORMAL LOW (ref 20.0–50.0)
Transferrin: 284 mg/dL (ref 212.0–360.0)

## 2020-09-19 LAB — VITAMIN D 25 HYDROXY (VIT D DEFICIENCY, FRACTURES): VITD: 32.18 ng/mL (ref 30.00–100.00)

## 2020-09-19 MED FILL — FERREX 150 CAPSULE: 150 | 30 days supply | Qty: 30 | Fill #2

## 2020-09-19 NOTE — Addendum Note (Signed)
Addended by: Sharon Seller B on: 09/19/2020 08:58 AM   Modules accepted: Orders

## 2020-09-19 NOTE — Addendum Note (Signed)
Addended by: Kelle Darting A on: 09/19/2020 09:14 AM   Modules accepted: Orders

## 2020-09-20 ENCOUNTER — Other Ambulatory Visit: Payer: 59

## 2021-02-13 ENCOUNTER — Other Ambulatory Visit (HOSPITAL_COMMUNITY): Payer: Self-pay

## 2021-02-13 ENCOUNTER — Other Ambulatory Visit: Payer: Self-pay | Admitting: Family Medicine

## 2021-02-13 MED ORDER — MEFLOQUINE HCL 250 MG PO TABS
250.0000 mg | ORAL_TABLET | ORAL | 0 refills | Status: DC
  Filled 2021-02-13: qty 12, 84d supply, fill #0
  Filled 2021-03-26: qty 14, 90d supply, fill #0

## 2021-02-21 ENCOUNTER — Other Ambulatory Visit (HOSPITAL_COMMUNITY): Payer: Self-pay

## 2021-02-22 ENCOUNTER — Ambulatory Visit: Payer: 59

## 2021-02-22 ENCOUNTER — Other Ambulatory Visit (HOSPITAL_BASED_OUTPATIENT_CLINIC_OR_DEPARTMENT_OTHER): Payer: Self-pay

## 2021-02-22 MED FILL — Polysaccharide Iron Complex Cap 150 MG (Iron Equivalent): ORAL | 100 days supply | Qty: 100 | Fill #0 | Status: AC

## 2021-02-22 MED FILL — Polysaccharide Iron Complex Cap 150 MG (Iron Equivalent): ORAL | 30 days supply | Qty: 30 | Fill #0 | Status: CN

## 2021-03-26 ENCOUNTER — Other Ambulatory Visit (HOSPITAL_COMMUNITY): Payer: Self-pay

## 2021-06-13 ENCOUNTER — Other Ambulatory Visit (HOSPITAL_BASED_OUTPATIENT_CLINIC_OR_DEPARTMENT_OTHER): Payer: Self-pay | Admitting: Family Medicine

## 2021-06-13 ENCOUNTER — Telehealth: Payer: Self-pay | Admitting: *Deleted

## 2021-06-13 DIAGNOSIS — Z1231 Encounter for screening mammogram for malignant neoplasm of breast: Secondary | ICD-10-CM

## 2021-06-13 NOTE — Telephone Encounter (Signed)
Called the patient back. She is keeping her appt as scheduled for Friday 06/15/21. She wanted to reschedule for tomorrow (06/14/21), but informed PCP is out of the office on Thursday. She said she would then keep her appointment for Friday 06/15/21 at 8 AM for her CPE.

## 2021-06-13 NOTE — Telephone Encounter (Signed)
Who Is Calling Patient / Member / Family / Caregiver Caller Name Donita Linzer Phone Number [phone] Patient Name Vanessa Daugherty Patient DOB [birth year] Call Type Message Only Information Provided Reason for Call Request to Reschedule Office Appointment Initial Comment Needs to reschedule appt, pls call back Patient request to speak to RN No Disp. Time Disposition Final User 06/13/2021 7:52:22 AM General Information Provided Yes Donato Heinz

## 2021-06-15 ENCOUNTER — Other Ambulatory Visit: Payer: Self-pay | Admitting: Family Medicine

## 2021-06-15 ENCOUNTER — Ambulatory Visit (INDEPENDENT_AMBULATORY_CARE_PROVIDER_SITE_OTHER): Payer: 59 | Admitting: Family Medicine

## 2021-06-15 ENCOUNTER — Other Ambulatory Visit (HOSPITAL_BASED_OUTPATIENT_CLINIC_OR_DEPARTMENT_OTHER): Payer: Self-pay

## 2021-06-15 ENCOUNTER — Encounter: Payer: Self-pay | Admitting: Family Medicine

## 2021-06-15 ENCOUNTER — Other Ambulatory Visit: Payer: Self-pay

## 2021-06-15 VITALS — BP 100/60 | HR 60 | Temp 98.1°F | Ht 64.0 in | Wt 136.2 lb

## 2021-06-15 DIAGNOSIS — R790 Abnormal level of blood mineral: Secondary | ICD-10-CM

## 2021-06-15 DIAGNOSIS — Z0000 Encounter for general adult medical examination without abnormal findings: Secondary | ICD-10-CM

## 2021-06-15 DIAGNOSIS — E559 Vitamin D deficiency, unspecified: Secondary | ICD-10-CM

## 2021-06-15 LAB — CBC
HCT: 34.8 % — ABNORMAL LOW (ref 36.0–46.0)
Hemoglobin: 11.4 g/dL — ABNORMAL LOW (ref 12.0–15.0)
MCHC: 32.8 g/dL (ref 30.0–36.0)
MCV: 89.9 fl (ref 78.0–100.0)
Platelets: 205 10*3/uL (ref 150.0–400.0)
RBC: 3.87 Mil/uL (ref 3.87–5.11)
RDW: 13.9 % (ref 11.5–15.5)
WBC: 4.2 10*3/uL (ref 4.0–10.5)

## 2021-06-15 LAB — IBC + FERRITIN
Ferritin: 12.5 ng/mL (ref 10.0–291.0)
Iron: 77 ug/dL (ref 42–145)
Saturation Ratios: 18.1 % — ABNORMAL LOW (ref 20.0–50.0)
TIBC: 425.6 ug/dL (ref 250.0–450.0)
Transferrin: 304 mg/dL (ref 212.0–360.0)

## 2021-06-15 LAB — COMPREHENSIVE METABOLIC PANEL
ALT: 15 U/L (ref 0–35)
AST: 13 U/L (ref 0–37)
Albumin: 4.2 g/dL (ref 3.5–5.2)
Alkaline Phosphatase: 48 U/L (ref 39–117)
BUN: 9 mg/dL (ref 6–23)
CO2: 28 mEq/L (ref 19–32)
Calcium: 9.4 mg/dL (ref 8.4–10.5)
Chloride: 104 mEq/L (ref 96–112)
Creatinine, Ser: 0.67 mg/dL (ref 0.40–1.20)
GFR: 106.33 mL/min (ref >60.00)
Glucose, Bld: 94 mg/dL (ref 70–99)
Potassium: 4.1 mEq/L (ref 3.5–5.1)
Sodium: 138 mEq/L (ref 135–145)
Total Bilirubin: 0.8 mg/dL (ref 0.2–1.2)
Total Protein: 7 g/dL (ref 6.0–8.3)

## 2021-06-15 LAB — LIPID PANEL
Cholesterol: 194 mg/dL (ref 0–200)
HDL: 40.9 mg/dL (ref >39.00)
LDL Cholesterol: 124 mg/dL — ABNORMAL HIGH (ref 0–99)
NonHDL: 152.77
Total CHOL/HDL Ratio: 5
Triglycerides: 144 mg/dL (ref 0.0–149.0)
VLDL: 28.8 mg/dL (ref 0.0–40.0)

## 2021-06-15 LAB — T4, FREE: Free T4: 0.88 ng/dL (ref 0.60–1.60)

## 2021-06-15 LAB — TSH: TSH: 3.35 u[IU]/mL (ref 0.35–5.50)

## 2021-06-15 LAB — VITAMIN D 25 HYDROXY (VIT D DEFICIENCY, FRACTURES): VITD: 14.78 ng/mL — ABNORMAL LOW (ref 30.00–100.00)

## 2021-06-15 LAB — HEMOGLOBIN A1C: Hgb A1c MFr Bld: 5.4 % (ref 4.6–6.5)

## 2021-06-15 MED ORDER — PANTOPRAZOLE SODIUM 40 MG PO TBEC
40.0000 mg | DELAYED_RELEASE_TABLET | Freq: Every day | ORAL | 1 refills | Status: DC
Start: 2021-06-15 — End: 2022-02-27
  Filled 2021-06-15: qty 30, 30d supply, fill #0
  Filled 2021-10-31: qty 30, 30d supply, fill #1

## 2021-06-15 MED ORDER — KETOROLAC TROMETHAMINE 10 MG PO TABS
10.0000 mg | ORAL_TABLET | Freq: Four times a day (QID) | ORAL | 2 refills | Status: DC | PRN
  Filled 2021-06-15: qty 30, 8d supply, fill #0

## 2021-06-15 MED ORDER — POLYSACCHARIDE IRON COMPLEX 150 MG PO CAPS
ORAL_CAPSULE | Freq: Every day | ORAL | 8 refills | Status: DC
  Filled 2021-06-15: qty 100, 100d supply, fill #0
  Filled 2021-10-31: qty 100, 100d supply, fill #1

## 2021-06-15 MED ORDER — VITAMIN D (ERGOCALCIFEROL) 1.25 MG (50000 UNIT) PO CAPS
50000.0000 [IU] | ORAL_CAPSULE | ORAL | 0 refills | Status: DC
  Filled 2021-06-15 – 2021-06-27 (×2): qty 12, 84d supply, fill #0

## 2021-06-15 MED ORDER — FLUTICASONE PROPIONATE 50 MCG/ACT NA SUSP
2.0000 | Freq: Every day | NASAL | 6 refills | Status: DC
  Filled 2021-06-15: qty 16, 30d supply, fill #0

## 2021-06-15 NOTE — Progress Notes (Signed)
Chief Complaint  Patient presents with   Annual Exam     Well Woman Vanessa Daugherty is here for a complete physical.   Her last physical was >1 year ago.  Current diet: in general, diet is fair. Current exercise: walking some. Weight is stable and she confirms continued fatigue out of ordinary. Seatbelt? Yes  Health Maintenance Pap/HPV- Yes Mammogram- Yes Tetanus- Yes Hep C screening- Yes HIV screening- Yes  Past Medical History:  Diagnosis Date   No known health problems      Past Surgical History:  Procedure Laterality Date   NO PAST SURGERIES      Medications  Current Outpatient Medications on File Prior to Visit  Medication Sig Dispense Refill   ketorolac (TORADOL) 10 MG tablet Take 1 tablet (10 mg total) by mouth every 6 (six) hours as needed. 30 tablet 2   pantoprazole (PROTONIX) 40 MG tablet Take 1 tablet (40 mg total) by mouth daily. 30 tablet 1   iron polysaccharides (NIFEREX) 150 MG capsule TAKE 1 CAPSULE (150 MG TOTAL) BY MOUTH DAILY. 100 capsule 8   Allergies No Known Allergies  Review of Systems: Constitutional:  no unexpected weight changes Eye:  no recent significant change in vision Ear/Nose/Mouth/Throat:  Ears:  no recent change in hearing Nose/Mouth/Throat:  no complaints of nasal congestion, no sore throat Cardiovascular: no chest pain Respiratory:  no shortness of breath Gastrointestinal:  no abdominal pain, no change in bowel habits GU:  Female: negative for dysuria or pelvic pain Musculoskeletal/Extremities:  no pain of the joints Integumentary (Skin/Breast):  no abnormal skin lesions reported Neurologic:  no headaches Endocrine:  denies fatigue Hematologic/Lymphatic:  No areas of easy bleeding  Exam BP 100/60   Pulse 60   Temp 98.1 F (36.7 C) (Oral)   Ht '5\' 4"'$  (1.626 m)   Wt 136 lb 4 oz (61.8 kg)   SpO2 99%   BMI 23.39 kg/m  General:  well developed, well nourished, in no apparent distress Skin:  no significant moles, warts,  or growths Head:  no masses, lesions, or tenderness Eyes:  pupils equal and round, sclera anicteric without injection Ears:  canals without lesions, TMs shiny without retraction, no obvious effusion, no erythema Nose:  nares patent, septum midline, mucosa normal, and no drainage or sinus tenderness Throat/Pharynx:  lips and gingiva without lesion; tongue and uvula midline; non-inflamed pharynx; no exudates or postnasal drainage Neck: neck supple without adenopathy, thyromegaly, or masses Lungs:  clear to auscultation, breath sounds equal bilaterally, no respiratory distress Cardio:  regular rate and rhythm, no LE edema Abdomen:  abdomen soft, nontender; bowel sounds normal; no masses or organomegaly Genital: Defer to GYN Musculoskeletal:  symmetrical muscle groups noted without atrophy or deformity Extremities:  no clubbing, cyanosis, or edema, no deformities, no skin discoloration Neuro:  gait normal; deep tendon reflexes normal and symmetric Psych: well oriented with normal range of affect and appropriate judgment/insight  Assessment and Plan  Well adult exam - Plan: IBC + Ferritin, Comprehensive metabolic panel, CBC, TSH, T4, free, Lipid panel, Hemoglobin A1c, VITAMIN D 25 Hydroxy (Vit-D Deficiency, Fractures)   Well 45 y.o. female. Counseled on diet and exercise. Other orders as above. Flu shot rec'd for mid Oct.  Rec'd starting a Vit D3 daily supp.  Follow up in 1 yr. The patient voiced understanding and agreement to the plan.  [place], DO 06/15/21 9:03 AM

## 2021-06-15 NOTE — Patient Instructions (Addendum)
Give us 2-3 business days to get the results of your labs back.   Keep the diet clean and stay active.  I recommend getting the flu shot in mid October. This suggestion would change if the CDC comes out with a different recommendation.   Let us know if you need anything. 

## 2021-06-26 ENCOUNTER — Other Ambulatory Visit (HOSPITAL_BASED_OUTPATIENT_CLINIC_OR_DEPARTMENT_OTHER): Payer: Self-pay

## 2021-06-27 ENCOUNTER — Other Ambulatory Visit (HOSPITAL_BASED_OUTPATIENT_CLINIC_OR_DEPARTMENT_OTHER): Payer: Self-pay

## 2021-07-16 ENCOUNTER — Telehealth (HOSPITAL_BASED_OUTPATIENT_CLINIC_OR_DEPARTMENT_OTHER): Payer: Self-pay

## 2021-07-23 ENCOUNTER — Ambulatory Visit (HOSPITAL_BASED_OUTPATIENT_CLINIC_OR_DEPARTMENT_OTHER): Payer: 59

## 2021-07-25 ENCOUNTER — Other Ambulatory Visit: Payer: Self-pay | Admitting: Family Medicine

## 2021-07-25 ENCOUNTER — Telehealth (HOSPITAL_BASED_OUTPATIENT_CLINIC_OR_DEPARTMENT_OTHER): Payer: Self-pay

## 2021-07-30 ENCOUNTER — Ambulatory Visit (HOSPITAL_BASED_OUTPATIENT_CLINIC_OR_DEPARTMENT_OTHER): Payer: 59

## 2021-08-02 ENCOUNTER — Ambulatory Visit (INDEPENDENT_AMBULATORY_CARE_PROVIDER_SITE_OTHER): Payer: 59

## 2021-08-02 ENCOUNTER — Other Ambulatory Visit: Payer: Self-pay

## 2021-08-02 DIAGNOSIS — Z1231 Encounter for screening mammogram for malignant neoplasm of breast: Secondary | ICD-10-CM | POA: Diagnosis not present

## 2021-08-06 ENCOUNTER — Telehealth: Payer: Self-pay | Admitting: Family Medicine

## 2021-08-06 ENCOUNTER — Other Ambulatory Visit: Payer: Self-pay | Admitting: Family Medicine

## 2021-08-06 DIAGNOSIS — R928 Other abnormal and inconclusive findings on diagnostic imaging of breast: Secondary | ICD-10-CM

## 2021-08-06 NOTE — Telephone Encounter (Signed)
[facility] called to let us know that they faxed over orders for Dr. Nani Ravens to sign. Fax was received and placed on [facility] [facility].

## 2021-08-06 NOTE — Telephone Encounter (Signed)
Pt. Called and stated she wanted to go over her mammogram.

## 2021-08-06 NOTE — Telephone Encounter (Signed)
Spoke w husband in addition to sending her a message. Ty.

## 2021-08-07 ENCOUNTER — Other Ambulatory Visit: Payer: Self-pay | Admitting: Family Medicine

## 2021-08-07 ENCOUNTER — Ambulatory Visit
Admission: RE | Admit: 2021-08-07 | Discharge: 2021-08-07 | Disposition: A | Discharge status: Home / Self Care | Payer: 59 | Source: Ambulatory Visit | Attending: Family Medicine | Admitting: Family Medicine

## 2021-08-07 ENCOUNTER — Other Ambulatory Visit: Payer: Self-pay

## 2021-08-07 DIAGNOSIS — R928 Other abnormal and inconclusive findings on diagnostic imaging of breast: Secondary | ICD-10-CM

## 2021-08-07 DIAGNOSIS — R921 Mammographic calcification found on diagnostic imaging of breast: Secondary | ICD-10-CM | POA: Diagnosis not present

## 2021-08-07 DIAGNOSIS — N6489 Other specified disorders of breast: Secondary | ICD-10-CM | POA: Diagnosis not present

## 2021-08-09 ENCOUNTER — Other Ambulatory Visit: Payer: Self-pay | Admitting: Family Medicine

## 2021-08-09 DIAGNOSIS — R921 Mammographic calcification found on diagnostic imaging of breast: Secondary | ICD-10-CM

## 2021-08-14 ENCOUNTER — Ambulatory Visit
Admission: RE | Admit: 2021-08-14 | Discharge: 2021-08-14 | Disposition: A | Discharge status: Home / Self Care | Payer: 59 | Source: Ambulatory Visit | Attending: Family Medicine | Admitting: Family Medicine

## 2021-08-14 DIAGNOSIS — R921 Mammographic calcification found on diagnostic imaging of breast: Secondary | ICD-10-CM

## 2021-08-14 DIAGNOSIS — N6012 Diffuse cystic mastopathy of left breast: Secondary | ICD-10-CM | POA: Diagnosis not present

## 2021-08-24 ENCOUNTER — Other Ambulatory Visit: Payer: 59

## 2021-09-14 ENCOUNTER — Other Ambulatory Visit (HOSPITAL_BASED_OUTPATIENT_CLINIC_OR_DEPARTMENT_OTHER): Payer: Self-pay

## 2021-09-14 DIAGNOSIS — H5213 Myopia, bilateral: Secondary | ICD-10-CM | POA: Diagnosis not present

## 2021-09-14 DIAGNOSIS — H524 Presbyopia: Secondary | ICD-10-CM | POA: Diagnosis not present

## 2021-09-14 DIAGNOSIS — H52223 Regular astigmatism, bilateral: Secondary | ICD-10-CM | POA: Diagnosis not present

## 2021-09-14 MED ORDER — HYDROCODONE-ACETAMINOPHEN 5-325 MG PO TABS
ORAL_TABLET | ORAL | 0 refills | Status: DC
  Filled 2021-09-14: qty 6, 2d supply, fill #0

## 2021-09-21 ENCOUNTER — Other Ambulatory Visit: Payer: 59

## 2021-09-24 ENCOUNTER — Other Ambulatory Visit (HOSPITAL_BASED_OUTPATIENT_CLINIC_OR_DEPARTMENT_OTHER): Payer: Self-pay

## 2021-09-26 ENCOUNTER — Ambulatory Visit: Payer: 59 | Admitting: Obstetrics & Gynecology

## 2021-10-24 ENCOUNTER — Ambulatory Visit: Payer: 59 | Admitting: Obstetrics & Gynecology

## 2021-10-31 ENCOUNTER — Other Ambulatory Visit (HOSPITAL_BASED_OUTPATIENT_CLINIC_OR_DEPARTMENT_OTHER): Payer: Self-pay

## 2021-11-14 ENCOUNTER — Other Ambulatory Visit: Payer: Self-pay

## 2021-11-14 ENCOUNTER — Encounter: Payer: Self-pay | Admitting: Obstetrics & Gynecology

## 2021-11-14 ENCOUNTER — Ambulatory Visit (INDEPENDENT_AMBULATORY_CARE_PROVIDER_SITE_OTHER): Payer: 59 | Admitting: Obstetrics & Gynecology

## 2021-11-14 VITALS — BP 122/85 | HR 78 | Resp 16 | Ht 64.0 in | Wt 137.0 lb

## 2021-11-14 DIAGNOSIS — Z01419 Encounter for gynecological examination (general) (routine) without abnormal findings: Secondary | ICD-10-CM

## 2021-11-14 DIAGNOSIS — Z1211 Encounter for screening for malignant neoplasm of colon: Secondary | ICD-10-CM | POA: Diagnosis not present

## 2021-11-14 NOTE — Progress Notes (Signed)
Left breast biopsy 9/22

## 2021-11-14 NOTE — Progress Notes (Signed)
Subjective:     Vanessa Daugherty is a 46 y.o. female here for a routine exam.  Current complaints: Pt upon questioning reports some SUI sx rarely. Does not consider this a problem and reports that this is only when her bladder is overfilled.        Gynecologic History Patient's last menstrual period was 11/01/2021. Contraception: condoms Last Pap: /04/2020. Results were: normal Last mammogram: 08/07/2021. Results were: BI-RADS CATEGORY  4: Suspicious 08/14/2021 Diagnosis Breast, left, needle core biopsy, UOQ, coil clip - COLUMNAR CELL AND FIBROCYSTIC CHANGES WITH CALCIFICATIONS - NO MALIGNANCY IDENTIFIED  Obstetric History OB History  Gravida Para Term Preterm AB Living  2 2 2     2   SAB IAB Ectopic Multiple Live Births          2    # Outcome Date GA Lbr Len/2nd Weight Sex Delivery Anes PTL Lv  2 Term 2014 [redacted]w[redacted]d   F Vag-Spont EPI N LIV  1 Term 2011 [redacted]w[redacted]d   F Vag-Spont EPI N LIV   The following portions of the patient's history were reviewed and updated as appropriate: allergies, current medications, past family history, past medical history, past social history, past surgical history, and problem list.  Review of Systems Pertinent items are noted in HPI.    Objective:  BP 122/85    Pulse 78    Resp 16    Ht 5\' 4"  (1.626 m)    Wt 137 lb (62.1 kg)    LMP 11/01/2021    BMI 23.52 kg/m   General Appearance:    Alert, cooperative, no distress, appears stated age  Head:    Normocephalic, without obvious abnormality, atraumatic  Eyes:    conjunctiva/corneas clear, EOM's intact, both eyes  Ears:    Normal external ear canals, both ears  Nose:   Nares normal, septum midline, mucosa normal, no drainage    or sinus tenderness  Throat:   Lips, mucosa, and tongue normal; teeth and gums normal  Neck:   Supple, symmetrical, trachea midline, no adenopathy;    thyroid:  no enlargement/tenderness/nodules  Back:     Symmetric, no curvature, ROM normal, no CVA tenderness  Lungs:     respirations  unlabored  Chest Wall:    No tenderness or deformity   Heart:    Regular rate and rhythm  Breast Exam:    No tenderness, masses, or nipple abnormality  Abdomen:     Soft, non-tender, bowel sounds active all four quadrants,    no masses, no organomegaly  Genitalia:    Normal female without lesion, discharge or tenderness     Extremities:   Extremities normal, atraumatic, no cyanosis or edema  Pulses:   2+ and symmetric all extremities  Skin:   Skin color, texture, turgor normal, no rashes or lesions     Assessment:    Healthy female exam.  Stress incontinence- occ. Does not want treatment at this time.     Plan:  Diagnoses and all orders for this visit:  Well female exam with routine gynecological exam  Colon cancer screening -     Ambulatory referral to Gastroenterology   F/u in 1 year or sooner prn   Tajah Schreiner L. Harraway-Smith, M.D., [name]

## 2021-12-25 ENCOUNTER — Encounter: Payer: Self-pay | Admitting: Internal Medicine

## 2022-01-23 ENCOUNTER — Other Ambulatory Visit (HOSPITAL_BASED_OUTPATIENT_CLINIC_OR_DEPARTMENT_OTHER): Payer: Self-pay

## 2022-01-23 ENCOUNTER — Ambulatory Visit (AMBULATORY_SURGERY_CENTER): Payer: 59

## 2022-01-23 VITALS — Ht 64.0 in | Wt 130.0 lb

## 2022-01-23 DIAGNOSIS — Z1211 Encounter for screening for malignant neoplasm of colon: Secondary | ICD-10-CM

## 2022-01-23 MED ORDER — NA SULFATE-K SULFATE-MG SULF 17.5-3.13-1.6 GM/177ML PO SOLN
1.0000 | Freq: Once | ORAL | 0 refills | Status: AC
  Filled 2022-01-23: qty 354, 1d supply, fill #0
  Filled 2022-02-27: qty 354, 2d supply, fill #0

## 2022-01-23 NOTE — Progress Notes (Signed)
No egg or soy allergy known to patient  ?No issues known to pt with past sedation with any surgeries or procedures ?Patient denies ever being told they had issues or difficulty with intubation  ?No FH of Malignant Hyperthermia ?Pt is not on diet pills ?Pt is not on home 02  ?Pt is not on blood thinners  ?Pt denies issues with constipation - has noticed recently noticed more bloating and cramps with bowel movements but is having diarrhea- not constipation- more of an "applesauce or peanut butter consistency; ?No A fib or A flutter ?NO PA's for preps discussed with pt in PV today  ?Discussed with pt there will be an out-of-pocket cost for prep and that varies from $0 to 70 + dollars - pt verbalized understanding  ?Pt instructed to use Singlecare.com or GoodRx for a price reduction on prep  ?PV completed over the phone. Pt verified name, DOB, address and insurance during PV today.  ?Pt mailed instruction packet with copy of consent form to read and not return, and instructions.  ?Pt encouraged to call with questions or issues.  ?If pt has My chart, procedure instructions sent via My Chart  ? ?

## 2022-02-01 ENCOUNTER — Other Ambulatory Visit (HOSPITAL_BASED_OUTPATIENT_CLINIC_OR_DEPARTMENT_OTHER): Payer: Self-pay

## 2022-02-06 ENCOUNTER — Encounter: Payer: 59 | Admitting: Internal Medicine

## 2022-02-18 ENCOUNTER — Encounter: Payer: 59 | Admitting: Internal Medicine

## 2022-02-27 ENCOUNTER — Other Ambulatory Visit (HOSPITAL_BASED_OUTPATIENT_CLINIC_OR_DEPARTMENT_OTHER): Payer: Self-pay

## 2022-02-27 ENCOUNTER — Ambulatory Visit: Payer: 59 | Admitting: Family Medicine

## 2022-02-27 ENCOUNTER — Encounter: Payer: Self-pay | Admitting: Family Medicine

## 2022-02-27 VITALS — BP 108/62 | HR 85 | Temp 100.5°F | Ht 63.0 in | Wt 136.1 lb

## 2022-02-27 DIAGNOSIS — J029 Acute pharyngitis, unspecified: Secondary | ICD-10-CM

## 2022-02-27 LAB — POCT RAPID STREP A (OFFICE): Rapid Strep A Screen: POSITIVE — AB

## 2022-02-27 MED ORDER — AMOXICILLIN 500 MG PO CAPS
1000.0000 mg | ORAL_CAPSULE | Freq: Every day | ORAL | 0 refills | Status: AC
  Filled 2022-02-27: qty 20, 10d supply, fill #0

## 2022-02-27 MED ORDER — PANTOPRAZOLE SODIUM 40 MG PO TBEC
40.0000 mg | DELAYED_RELEASE_TABLET | Freq: Every day | ORAL | 1 refills | Status: DC
  Filled 2022-02-27: qty 90, 90d supply, fill #0

## 2022-02-27 MED ORDER — MELOXICAM 7.5 MG PO TABS
7.5000 mg | ORAL_TABLET | Freq: Every day | ORAL | 0 refills | Status: DC
  Filled 2022-02-27: qty 30, 30d supply, fill #0

## 2022-02-27 NOTE — Patient Instructions (Addendum)
OK to take Tylenol 1000 mg (2 extra strength tabs) or 975 mg (3 regular strength tabs) every 6 hours as needed.  Ibuprofen 400-600 mg (2-3 over the counter strength tabs) every 6 hours as needed for pain.  Stay hydrated.  Throw out your toothbrush after 24 hours of being on antibiotic.   Let us know if you need anything.

## 2022-02-27 NOTE — Progress Notes (Signed)
SUBJECTIVE:   Vanessa Daugherty is a 46 y.o. female presents to the clinic for:  Chief Complaint  Patient presents with   Sore Throat    Headache    Complains of sore throat for 1 day.  Other associated symptoms: subjective fever, sinus headache, and sore throat.  Denies: sinus congestion, rhinorrhea, itchy watery eyes, ear pain, ear drainage, wheezing, shortness of breath, and coughing, N?V Sick Contacts: none known Therapy to date: antihistamines and Tylenol  Social History   Tobacco Use  Smoking Status Never  Smokeless Tobacco Never    Patient's medications, allergies, past medical, surgical, social and family histories were reviewed and updated as appropriate.  OBJECTIVE:  BP 108/62   Pulse 85   Temp (!) 100.5 F (38.1 C) (Oral)   Ht '5\' 3"'$  (1.6 m)   Wt 136 lb 2 oz (61.7 kg)   SpO2 99%   BMI 24.11 kg/m  General: Awake, alert, appearing stated age Eyes: conjunctivae and sclerae clear Ears: normal TMs bilaterally Nose: no visible exudate Oropharynx:  MMM, tonsils are 2+ b/l, erythematous w/ exudates bilaterally Neck: supple, cerv adenopathy b/l w ttp Lungs: clear to auscultation, no wheezes, rales or rhonchi, symmetric air entry, normal effort Heart: RRR Skin:reveals no rash Psych: Age appropriate judgment and insight  ASSESSMENT/PLAN:  Sore throat - Plan: POCT rapid strep A  Continue to practice good hand hygiene and push fluids. 4/4 Centor, +Rapid strep. 10 d amox 1 g/d.  Mobic 7.5 mg/d and acetaminophen for pain. Replace toothbrush after 24 hours of being on abx. F/u prn. Pt voiced understanding and agreement to the plan.  [place], DO 02/27/22 11:16 AM

## 2022-03-01 ENCOUNTER — Encounter: Payer: Self-pay | Admitting: Internal Medicine

## 2022-03-07 ENCOUNTER — Ambulatory Visit (AMBULATORY_SURGERY_CENTER): Payer: 59 | Admitting: Internal Medicine

## 2022-03-07 ENCOUNTER — Encounter: Payer: Self-pay | Admitting: Internal Medicine

## 2022-03-07 VITALS — BP 115/73 | HR 62 | Temp 98.0°F | Resp 17 | Ht 64.0 in | Wt 130.0 lb

## 2022-03-07 DIAGNOSIS — D122 Benign neoplasm of ascending colon: Secondary | ICD-10-CM

## 2022-03-07 DIAGNOSIS — Z1211 Encounter for screening for malignant neoplasm of colon: Secondary | ICD-10-CM | POA: Diagnosis not present

## 2022-03-07 MED ORDER — SODIUM CHLORIDE 0.9 % IV SOLN: 500.0000 mL | Freq: Once | INTRAVENOUS | Status: DC

## 2022-03-07 NOTE — Progress Notes (Signed)
Pt's states no medical or surgical changes since previsit or office visit. 

## 2022-03-07 NOTE — Progress Notes (Signed)
Called to room to assist during endoscopic procedure.  Patient ID and intended procedure confirmed with present staff. Received instructions for my participation in the procedure from the performing physician.  

## 2022-03-07 NOTE — Progress Notes (Signed)
To pacu, VSS. Report to Rn,tb 

## 2022-03-07 NOTE — Op Note (Signed)
[place] Patient Name: Vanessa Daugherty Procedure Date: 03/07/2022 10:27 AM MRN: [id number] Endoscopist: Sonny Masters "Christia Reading ,  Age: 46 Referring MD:  Date of Birth: 09-10-1976 Gender: Female Account #: [id number] Procedure:                Colonoscopy Indications:              Screening for colorectal malignant neoplasm Medicines:                Monitored Anesthesia Care Procedure:                Pre-Anesthesia Assessment:                           - Prior to the procedure, a History and Physical                            was performed, and patient medications and                            allergies were reviewed. The patient's tolerance of                            previous anesthesia was also reviewed. The risks                            and benefits of the procedure and the sedation                            options and risks were discussed with the patient.                            All questions were answered, and informed consent                            was obtained. Prior Anticoagulants: The patient has                            taken no previous anticoagulant or antiplatelet                            agents. ASA Grade Assessment: II - A patient with                            mild systemic disease. After reviewing the risks                            and benefits, the patient was deemed in                            satisfactory condition to undergo the procedure.                           After obtaining informed consent, the colonoscope  was passed under direct vision. Throughout the                            procedure, the patient's blood pressure, pulse, and                            oxygen saturations were monitored continuously. The                            PCF-HQ190L Colonoscope was introduced through the                            anus and advanced to the the terminal ileum. The                             colonoscopy was performed without difficulty. The                            patient tolerated the procedure well. The quality                            of the bowel preparation was good. The terminal                            ileum, ileocecal valve, appendiceal orifice, and                            rectum were photographed. Scope In: 10:37:42 AM Scope Out: 10:55:19 AM Scope Withdrawal Time: 0 hours 13 minutes 42 seconds  Total Procedure Duration: 0 hours 17 minutes 37 seconds  Findings:                 The terminal ileum appeared normal.                           A 4 mm polyp was found in the ascending colon. The                            polyp was sessile. The polyp was removed with a                            cold snare. Resection and retrieval were complete.                           Non-bleeding internal hemorrhoids were found during                            retroflexion. Complications:            No immediate complications. Estimated Blood Loss:     Estimated blood loss was minimal. Impression:               - The examined portion of the ileum was normal.                           -  One 4 mm polyp in the ascending colon, removed                            with a cold snare. Resected and retrieved.                           - Non-bleeding internal hemorrhoids. Recommendation:           - Discharge patient to home (with escort).                           - Await pathology results.                           - The findings and recommendations were discussed                            with the patient. Sonny Masters "Christia Reading,  03/07/2022 11:06:24 AM

## 2022-03-07 NOTE — Patient Instructions (Signed)
  Handouts given on Polyps and Hemorrhoids.    YOU HAD AN ENDOSCOPIC PROCEDURE TODAY AT [facility]:   Refer to the procedure report that was given to you for any specific questions about what was found during the examination.  If the procedure report does not answer your questions, please call your gastroenterologist to clarify.  If you requested that your care partner not be given the details of your procedure findings, then the procedure report has been included in a sealed envelope for you to review at your convenience later.  YOU SHOULD EXPECT: Some feelings of bloating in the abdomen. Passage of more gas than usual.  Walking can help get rid of the air that was put into your GI tract during the procedure and reduce the bloating. If you had a lower endoscopy (such as a colonoscopy or flexible sigmoidoscopy) you may notice spotting of blood in your stool or on the toilet paper. If you underwent a bowel prep for your procedure, you may not have a normal bowel movement for a few days.  Please Note:  You might notice some irritation and congestion in your nose or some drainage.  This is from the oxygen used during your procedure.  There is no need for concern and it should clear up in a day or so.  SYMPTOMS TO REPORT IMMEDIATELY:  Following lower endoscopy (colonoscopy or flexible sigmoidoscopy):  Excessive amounts of blood in the stool  Significant tenderness or worsening of abdominal pains  Swelling of the abdomen that is new, acute  Fever of 100F or higher   For urgent or emergent issues, a gastroenterologist can be reached at any hour by calling [phone]. Do not use MyChart messaging for urgent concerns.    DIET:  We do recommend a small meal at first, but then you may proceed to your regular diet.  Drink plenty of fluids but you should avoid alcoholic beverages for 24 hours.  ACTIVITY:  You should plan to take it easy for the rest of today and you should NOT  DRIVE or use heavy machinery until tomorrow (because of the sedation medicines used during the test).    FOLLOW UP: Our staff will call the number listed on your records 48-72 hours following your procedure to check on you and address any questions or concerns that you may have regarding the information given to you following your procedure. If we do not reach you, we will leave a message.  We will attempt to reach you two times.  During this call, we will ask if you have developed any symptoms of COVID 19. If you develop any symptoms (ie: fever, flu-like symptoms, shortness of breath, cough etc.) before then, please call [phone].  If you test positive for Covid 19 in the 2 weeks post procedure, please call and report this information to [place].    If any biopsies were taken you will be contacted by phone or by letter within the next 1-3 weeks.  Please call us at [phone] if you have not heard about the biopsies in 3 weeks.    SIGNATURES/CONFIDENTIALITY: You and/or your care partner have signed paperwork which will be entered into your electronic medical record.  These signatures attest to the fact that that the information above on your After Visit Summary has been reviewed and is understood.  Full responsibility of the confidentiality of this discharge information lies with you and/or your care-partner.

## 2022-03-07 NOTE — Progress Notes (Signed)
GASTROENTEROLOGY PROCEDURE H&P NOTE   Primary Care Physician: Shelda Pal, DO    Reason for Procedure:   Colon cancer screening  Plan:    Colonoscopy  Patient is appropriate for endoscopic procedure(s) in the ambulatory ([facility]) setting.  The nature of the procedure, as well as the risks, benefits, and alternatives were carefully and thoroughly reviewed with the patient. Ample time for discussion and questions allowed. The patient understood, was satisfied, and agreed to proceed.     HPI: Vanessa Daugherty is a 46 y.o. female who presents for colonoscopy for colon cancer screening. She had a colonoscopy 10 years ago that was normal. Has occasional diarrhea when she eats certain foods such as cheese. Denies blood in stools or weight loss. Denies fam hx of colon cancer.  Past Medical History:  Diagnosis Date   Anemia    on meds   Seasonal allergies     Past Surgical History:  Procedure Laterality Date   NO PAST SURGERIES      Prior to Admission medications   Medication Sig Start Date End Date Taking? Authorizing Provider  amoxicillin (AMOXIL) 500 MG capsule Take 2 capsules (1,000 mg total) by mouth daily for 10 days. 02/27/22 03/09/22 Yes Wendling, Crosby Oyster, DO  MAGNESIUM PO Take 1 tablet by mouth daily as needed.   Yes [provider]  meloxicam (MOBIC) 7.5 MG tablet Take 1 tablet (7.5 mg total) by mouth daily. 02/27/22  Yes Shelda Pal, DO  pantoprazole (PROTONIX) 40 MG tablet Take 1 tablet (40 mg total) by mouth daily. 02/27/22  Yes Shelda Pal, DO  iron polysaccharides (NIFEREX) 150 MG capsule TAKE 1 CAPSULE (150 MG TOTAL) BY MOUTH DAILY. 06/15/21 06/15/22  Shelda Pal, DO    Current Outpatient Medications  Medication Sig Dispense Refill   amoxicillin (AMOXIL) 500 MG capsule Take 2 capsules (1,000 mg total) by mouth daily for 10 days. 20 capsule 0   MAGNESIUM PO Take 1 tablet by mouth daily as needed.     meloxicam  (MOBIC) 7.5 MG tablet Take 1 tablet (7.5 mg total) by mouth daily. 30 tablet 0   pantoprazole (PROTONIX) 40 MG tablet Take 1 tablet (40 mg total) by mouth daily. 90 tablet 1   iron polysaccharides (NIFEREX) 150 MG capsule TAKE 1 CAPSULE (150 MG TOTAL) BY MOUTH DAILY. 100 capsule 8   Current Facility-Administered Medications  Medication Dose Route Frequency Provider Last Rate Last Admin   0.9 %  sodium chloride infusion  500 mL Intravenous Once Sharyn Creamer, MD        Allergies as of 03/07/2022   (No Known Allergies)    Family History  Problem Relation Age of Onset   Hypertension Mother    Hypertension Father    Colon polyps Neg Hx    Colon cancer Neg Hx    Esophageal cancer Neg Hx    Stomach cancer Neg Hx    Rectal cancer Neg Hx     Social History   Socioeconomic History   Marital status: Married    Spouse name: Not on file   Number of children: Not on file   Years of education: Not on file   Highest education level: Not on file  Occupational History   Not on file  Tobacco Use   Smoking status: Never   Smokeless tobacco: Never  Vaping Use   Vaping Use: Never used  Substance and Sexual Activity   Alcohol use: Never   Drug use: Never  Sexual activity: Yes    Birth control/protection: Condom  Other Topics Concern   Not on file  Social History Narrative   Not on file   Social Determinants of Health   Financial Resource Strain: Not on file  Food Insecurity: Not on file  Transportation Needs: Not on file  Physical Activity: Not on file  Stress: Not on file  Social Connections: Not on file  Intimate Partner Violence: Not on file    Physical Exam: Vital signs in last 24 hours: BP 139/76   Pulse 62   Temp 98 F (36.7 C) (Temporal)   Ht '5\' 4"'$  (1.626 m)   Wt 130 lb (59 kg)   LMP 02/18/2022 (Exact Date)   SpO2 100%   BMI 22.31 kg/m  GEN: NAD EYE: Sclerae anicteric ENT: MMM CV: Non-tachycardic Pulm: No increased work of breathing GI: Soft,  NT/ND NEURO:  Alert & Oriented   Christia Reading, MD [facility]  03/07/2022 10:32 AM

## 2022-03-08 ENCOUNTER — Telehealth: Payer: Self-pay | Admitting: *Deleted

## 2022-03-08 NOTE — Telephone Encounter (Signed)
  Follow up Call-     03/07/2022    9:51 AM  Call back number  Post procedure Call Back phone  # [phone]  Permission to leave phone message Yes     Patient questions:  Do you have a fever, pain , or abdominal swelling? No. Pain Score  0 *  Have you tolerated food without any problems? Yes.    Have you been able to return to your normal activities? Yes.    Do you have any questions about your discharge instructions: Diet   No. Medications  No. Follow up visit  No.  Do you have questions or concerns about your Care? No.  Actions: * If pain score is 4 or above: No action needed, pain <4.

## 2022-03-12 ENCOUNTER — Encounter: Payer: Self-pay | Admitting: Internal Medicine

## 2022-06-19 ENCOUNTER — Encounter: Payer: 59 | Admitting: Family Medicine

## 2022-06-27 ENCOUNTER — Other Ambulatory Visit (INDEPENDENT_AMBULATORY_CARE_PROVIDER_SITE_OTHER): Payer: 59

## 2022-06-27 ENCOUNTER — Ambulatory Visit (INDEPENDENT_AMBULATORY_CARE_PROVIDER_SITE_OTHER): Payer: 59 | Admitting: Family Medicine

## 2022-06-27 ENCOUNTER — Encounter: Payer: Self-pay | Admitting: Family Medicine

## 2022-06-27 VITALS — BP 116/79 | HR 68 | Temp 98.0°F | Ht 63.0 in | Wt 139.2 lb

## 2022-06-27 DIAGNOSIS — E559 Vitamin D deficiency, unspecified: Secondary | ICD-10-CM

## 2022-06-27 DIAGNOSIS — E785 Hyperlipidemia, unspecified: Secondary | ICD-10-CM

## 2022-06-27 DIAGNOSIS — Z23 Encounter for immunization: Secondary | ICD-10-CM

## 2022-06-27 DIAGNOSIS — Z0000 Encounter for general adult medical examination without abnormal findings: Secondary | ICD-10-CM

## 2022-06-27 DIAGNOSIS — R790 Abnormal level of blood mineral: Secondary | ICD-10-CM

## 2022-06-27 LAB — CBC
HCT: 37.6 % (ref 36.0–46.0)
Hemoglobin: 12.4 g/dL (ref 12.0–15.0)
MCHC: 33 g/dL (ref 30.0–36.0)
MCV: 91.6 fl (ref 78.0–100.0)
Platelets: 214 10*3/uL (ref 150.0–400.0)
RBC: 4.11 Mil/uL (ref 3.87–5.11)
RDW: 14.2 % (ref 11.5–15.5)
WBC: 6 10*3/uL (ref 4.0–10.5)

## 2022-06-27 LAB — LIPID PANEL
Cholesterol: 222 mg/dL — ABNORMAL HIGH (ref 0–200)
HDL: 42.1 mg/dL (ref >39.00)
NonHDL: 180.27
Total CHOL/HDL Ratio: 5
Triglycerides: 222 mg/dL — ABNORMAL HIGH (ref 0.0–149.0)
VLDL: 44.4 mg/dL — ABNORMAL HIGH (ref 0.0–40.0)

## 2022-06-27 LAB — COMPREHENSIVE METABOLIC PANEL
ALT: 20 U/L (ref 0–35)
AST: 17 U/L (ref 0–37)
Albumin: 4.1 g/dL (ref 3.5–5.2)
Alkaline Phosphatase: 55 U/L (ref 39–117)
BUN: 11 mg/dL (ref 6–23)
CO2: 28 mEq/L (ref 19–32)
Calcium: 9.3 mg/dL (ref 8.4–10.5)
Chloride: 102 mEq/L (ref 96–112)
Creatinine, Ser: 0.64 mg/dL (ref 0.40–1.20)
GFR: 106.73 mL/min (ref >60.00)
Glucose, Bld: 90 mg/dL (ref 70–99)
Potassium: 4.3 mEq/L (ref 3.5–5.1)
Sodium: 136 mEq/L (ref 135–145)
Total Bilirubin: 1 mg/dL (ref 0.2–1.2)
Total Protein: 7.2 g/dL (ref 6.0–8.3)

## 2022-06-27 LAB — IBC + FERRITIN
Ferritin: 19.3 ng/mL (ref 10.0–291.0)
Iron: 153 ug/dL — ABNORMAL HIGH (ref 42–145)
Saturation Ratios: 39.2 % (ref 20.0–50.0)
TIBC: 390.6 ug/dL (ref 250.0–450.0)
Transferrin: 279 mg/dL (ref 212.0–360.0)

## 2022-06-27 LAB — LDL CHOLESTEROL, DIRECT: Direct LDL: 145 mg/dL

## 2022-06-27 LAB — VITAMIN D 25 HYDROXY (VIT D DEFICIENCY, FRACTURES): VITD: 39.71 ng/mL (ref 30.00–100.00)

## 2022-06-27 NOTE — Patient Instructions (Signed)
Give [place] 2-3 business days to get the results of your labs back.   Keep the diet clean and stay active.  Please get me a copy of your advanced directive form at your convenience.   Foods that may reduce pain: 1) Ginger 2) Blueberries 3) Salmon 4) Pumpkin seeds 5) dark chocolate 6) turmeric 7) tart cherries 8) virgin olive oil 9) chilli peppers 10) mint 11) krill oil  Let us know if you need anything.  Hand Exercises Hand exercises can be helpful for almost anyone. These exercises can strengthen the hands, improve flexibility and movement, and increase blood flow to the hands. These results can make work and daily tasks easier. Hand exercises can be especially helpful for people who have joint pain from arthritis or have nerve damage from overuse (carpal tunnel syndrome). These exercises can also help people who have injured a hand. Exercises Most of these hand exercises are gentle stretching and motion exercises. It is usually safe to do them often throughout the day. Warming up your hands before exercise may help to reduce stiffness. You can do this with gentle massage or by placing your hands in warm water for 10-15 minutes. It is normal to feel some stretching, pulling, tightness, or mild discomfort as you begin new exercises. This will gradually improve. Stop an exercise right away if you feel sudden, severe pain or your pain gets worse. Ask your health care provider which exercises are best for you. Knuckle bend or "claw" fist Stand or sit with your arm, hand, and all five fingers pointed straight up. Make sure to keep your wrist straight during the exercise. Gently bend your fingers down toward your palm until the tips of your fingers are touching the top of your palm. Keep your big knuckle straight and just bend the small knuckles in your fingers. Hold this position for 3 seconds. Straighten (extend) your fingers back to the starting position. Repeat this exercise 5-10 times with  each hand. Full finger fist Stand or sit with your arm, hand, and all five fingers pointed straight up. Make sure to keep your wrist straight during the exercise. Gently bend your fingers into your palm until the tips of your fingers are touching the middle of your palm. Hold this position for 3 seconds. Extend your fingers back to the starting position, stretching every joint fully. Repeat this exercise 5-10 times with each hand. Straight fist Stand or sit with your arm, hand, and all five fingers pointed straight up. Make sure to keep your wrist straight during the exercise. Gently bend your fingers at the big knuckle, where your fingers meet your hand, and the middle knuckle. Keep the knuckle at the tips of your fingers straight and try to touch the bottom of your palm. Hold this position for 3 seconds. Extend your fingers back to the starting position, stretching every joint fully. Repeat this exercise 5-10 times with each hand. Tabletop Stand or sit with your arm, hand, and all five fingers pointed straight up. Make sure to keep your wrist straight during the exercise. Gently bend your fingers at the big knuckle, where your fingers meet your hand, as far down as you can while keeping the small knuckles in your fingers straight. Think of forming a tabletop with your fingers. Hold this position for 3 seconds. Extend your fingers back to the starting position, stretching every joint fully. Repeat this exercise 5-10 times with each hand. Finger spread Place your hand flat on a table with your  palm facing down. Make sure your wrist stays straight as you do this exercise. Spread your fingers and thumb apart from each other as far as you can until you feel a gentle stretch. Hold this position for 3 seconds. Bring your fingers and thumb tight together again. Hold this position for 3 seconds. Repeat this exercise 5-10 times with each hand. Making circles Stand or sit with your arm, hand, and all  five fingers pointed straight up. Make sure to keep your wrist straight during the exercise. Make a circle by touching the tip of your thumb to the tip of your index finger. Hold for 3 seconds. Then open your hand wide. Repeat this motion with your thumb and each finger on your hand. Repeat this exercise 5-10 times with each hand. Thumb motion Sit with your forearm resting on a table and your wrist straight. Your thumb should be facing up toward the ceiling. Keep your fingers relaxed as you move your thumb. Lift your thumb up as high as you can toward the ceiling. Hold for 3 seconds. Bend your thumb across your palm as far as you can, reaching the tip of your thumb for the small finger (pinkie) side of your palm. Hold for 3 seconds. Repeat this exercise 5-10 times with each hand. Grip strengthening  Hold a stress ball or other soft ball in the middle of your hand. Slowly increase the pressure, squeezing the ball as much as you can without causing pain. Think of bringing the tips of your fingers into the middle of your palm. All of your finger joints should bend when doing this exercise. Hold your squeeze for 3 seconds, then relax. Repeat this exercise 5-10 times with each hand. Contact a health care provider if: Your hand pain or discomfort gets much worse when you do an exercise. Your hand pain or discomfort does not improve within 2 hours after you exercise. If you have any of these problems, stop doing these exercises right away. Do not do them again unless your health care provider says that you can. Get help right away if: You develop sudden, severe hand pain or swelling. If this happens, stop doing these exercises right away. Do not do them again unless your health care provider says that you can. Make sure you discuss any questions you have with your health care provider. Document Revised: 01/14/2019 Document Reviewed: 09/24/2018 Elsevier Patient Education  [place].

## 2022-06-27 NOTE — Progress Notes (Signed)
Chief Complaint  Patient presents with   Annual Exam    Fasting    Sore Throat    Going on a week      Well Woman Vanessa Daugherty is here for a complete physical.   Her last physical was >1 year ago.  Current diet: in general, a "healthy" diet. Current exercise: walking, yoga. Weight is stable and she denies new fatigue out of ordinary. Patient's last menstrual period was 06/20/2022. Seatbelt? Yes Advanced directive? No  Health Maintenance Pap/HPV- Yes Mammogram- Yes Tetanus- Yes Hep C screening- Yes HIV screening- Yes  Past Medical History:  Diagnosis Date   Anemia    on meds   Seasonal allergies      Past Surgical History:  Procedure Laterality Date   NO PAST SURGERIES      Medications  Current Outpatient Medications on File Prior to Visit  Medication Sig Dispense Refill   MAGNESIUM PO Take 1 tablet by mouth daily as needed.     pantoprazole (PROTONIX) 40 MG tablet Take 1 tablet (40 mg total) by mouth daily. 90 tablet 1   iron polysaccharides (NIFEREX) 150 MG capsule TAKE 1 CAPSULE (150 MG TOTAL) BY MOUTH DAILY. 100 capsule 8   No current facility-administered medications on file prior to visit.     Allergies No Known Allergies  Review of Systems: Constitutional:  no unexpected weight changes Eye:  no recent significant change in vision Ear/Nose/Mouth/Throat:  Ears:  no recent change in hearing Nose/Mouth/Throat:  no complaints of nasal congestion, no sore throat Cardiovascular: no chest pain Respiratory:  no shortness of breath Gastrointestinal:  no abdominal pain, no change in bowel habits GU:  Female: negative for dysuria or pelvic pain Musculoskeletal/Extremities:  no pain of the joints Integumentary (Skin/Breast):  no abnormal skin lesions reported Neurologic:  no headaches Endocrine:  denies fatigue Hematologic/Lymphatic:  No areas of easy bleeding  Exam BP 116/79   Pulse 68   Temp 98 F (36.7 C)   Ht '5\' 3"'$  (1.6 m)   Wt 139 lb 3.2 oz  (63.1 kg)   LMP 06/20/2022   SpO2 100%   BMI 24.66 kg/m  General:  well developed, well nourished, in no apparent distress Skin:  no significant moles, warts, or growths Head:  no masses, lesions, or tenderness Eyes:  pupils equal and round, sclera anicteric without injection Ears:  canals without lesions, TMs shiny without retraction, no obvious effusion, no erythema Nose:  nares patent, mucosa normal, and no drainage Throat/Pharynx:  lips and gingiva without lesion; tongue and uvula midline; hyperemic tonsillar pillars, non-inflamed pharynx; no exudates, + postnasal drainage Neck: neck supple without adenopathy, thyromegaly, or masses Lungs:  clear to auscultation, breath sounds equal bilaterally, no respiratory distress Cardio:  regular rate and rhythm, no LE edema Abdomen:  abdomen soft, nontender; bowel sounds normal; no masses or organomegaly Genital: Defer to GYN Musculoskeletal:  symmetrical muscle groups noted without atrophy or deformity Extremities:  no clubbing, cyanosis, or edema, no deformities, no skin discoloration Neuro:  gait normal; deep tendon reflexes normal and symmetric Psych: well oriented with normal range of affect and appropriate judgment/insight  Assessment and Plan  Well adult exam - Plan: Lipid panel, CBC, Comprehensive metabolic panel, Hemoglobin A1c  Vitamin D deficiency - Plan: VITAMIN D 25 Hydroxy (Vit-D Deficiency, Fractures)  Low iron stores - Plan: IBC + Ferritin   Well 46 y.o. female. Counseled on diet and exercise. Advanced directive form provided today.  Flu shot today.  Other orders  as above. Follow up in 1 yr or prn. The patient voiced understanding and agreement to the plan.  [place], DO 06/27/22 8:54 AM

## 2022-06-28 NOTE — Addendum Note (Signed)
Addended by: Jeronimo Greaves on: 06/28/2022 08:39 AM   Modules accepted: Orders

## 2022-07-01 LAB — HEMOGLOBIN A1C: Hgb A1c MFr Bld: 5.6 % (ref 4.6–6.5)

## 2022-09-03 ENCOUNTER — Other Ambulatory Visit: Payer: Self-pay | Admitting: Family Medicine

## 2022-09-03 ENCOUNTER — Encounter: Payer: Self-pay | Admitting: Family Medicine

## 2022-09-03 DIAGNOSIS — Z1231 Encounter for screening mammogram for malignant neoplasm of breast: Secondary | ICD-10-CM

## 2022-09-06 DIAGNOSIS — H524 Presbyopia: Secondary | ICD-10-CM | POA: Diagnosis not present

## 2022-09-06 DIAGNOSIS — Z135 Encounter for screening for eye and ear disorders: Secondary | ICD-10-CM | POA: Diagnosis not present

## 2022-09-13 ENCOUNTER — Ambulatory Visit
Admission: RE | Admit: 2022-09-13 | Discharge: 2022-09-13 | Disposition: A | Discharge status: Home / Self Care | Payer: 59 | Source: Ambulatory Visit

## 2022-09-13 DIAGNOSIS — Z1231 Encounter for screening mammogram for malignant neoplasm of breast: Secondary | ICD-10-CM | POA: Diagnosis not present

## 2022-11-22 ENCOUNTER — Encounter: Payer: Self-pay | Admitting: General Practice

## 2023-02-10 ENCOUNTER — Encounter: Payer: Self-pay | Admitting: Family Medicine

## 2023-02-25 ENCOUNTER — Ambulatory Visit: Payer: Self-pay | Admitting: Family Medicine

## 2023-04-02 ENCOUNTER — Other Ambulatory Visit (HOSPITAL_COMMUNITY)
Admission: RE | Admit: 2023-04-02 | Discharge: 2023-04-02 | Disposition: A | Discharge status: Home / Self Care | Payer: 59 | Source: Ambulatory Visit | Attending: Obstetrics & Gynecology | Admitting: Obstetrics & Gynecology

## 2023-04-02 ENCOUNTER — Ambulatory Visit (HOSPITAL_BASED_OUTPATIENT_CLINIC_OR_DEPARTMENT_OTHER)
Admission: RE | Admit: 2023-04-02 | Discharge: 2023-04-02 | Disposition: A | Discharge status: Home / Self Care | Payer: 59 | Source: Ambulatory Visit | Attending: Obstetrics & Gynecology | Admitting: Obstetrics & Gynecology

## 2023-04-02 ENCOUNTER — Encounter: Payer: Self-pay | Admitting: Obstetrics & Gynecology

## 2023-04-02 ENCOUNTER — Ambulatory Visit: Payer: 59 | Admitting: Obstetrics & Gynecology

## 2023-04-02 VITALS — BP 114/74 | HR 80 | Ht 64.0 in | Wt 138.0 lb

## 2023-04-02 DIAGNOSIS — N854 Malposition of uterus: Secondary | ICD-10-CM | POA: Diagnosis not present

## 2023-04-02 DIAGNOSIS — N926 Irregular menstruation, unspecified: Secondary | ICD-10-CM

## 2023-04-02 DIAGNOSIS — Z01419 Encounter for gynecological examination (general) (routine) without abnormal findings: Secondary | ICD-10-CM | POA: Insufficient documentation

## 2023-04-02 DIAGNOSIS — Z1339 Encounter for screening examination for other mental health and behavioral disorders: Secondary | ICD-10-CM

## 2023-04-02 DIAGNOSIS — Z1231 Encounter for screening mammogram for malignant neoplasm of breast: Secondary | ICD-10-CM | POA: Diagnosis not present

## 2023-04-02 DIAGNOSIS — D252 Subserosal leiomyoma of uterus: Secondary | ICD-10-CM | POA: Diagnosis not present

## 2023-04-02 DIAGNOSIS — N8311 Corpus luteum cyst of right ovary: Secondary | ICD-10-CM | POA: Diagnosis not present

## 2023-04-02 NOTE — Progress Notes (Signed)
Subjective:     Vanessa Daugherty is a 47 y.o. female here for a routine exam.  Current complaints: Pt reports that for the past 6 months her cycles have come every 21 days vs every 28 days. She also reports occ hot flushes. Her bleeding is not assoc with pain. No weight loss or other health changes. She has a h/o fibromyalgia and reports back pain. Not worse with bleeding.      Gynecologic History Patient's last menstrual period was 03/18/2023 (exact date). Contraception: condoms Last Pap: 01/12/2020. Results were: normal Last mammogram: 09/13/2022. Results were: normal  Obstetric History OB History  Gravida Para Term Preterm AB Living  2 2 2     2   SAB IAB Ectopic Multiple Live Births          2    # Outcome Date GA Lbr Len/2nd Weight Sex Delivery Anes PTL Lv  2 Term 2014 [redacted]w[redacted]d   F Vag-Spont EPI N LIV  1 Term 2011 [redacted]w[redacted]d   F Vag-Spont EPI N LIV    The following portions of the patient's history were reviewed and updated as appropriate: allergies, current medications, past family history, past medical history, past social history, past surgical history, and problem list.  Review of Systems Pertinent items are noted in HPI.    Objective:  BP 114/74   Pulse 80   Ht 5\' 4"  (1.626 m)   Wt 138 lb (62.6 kg)   LMP 03/18/2023 (Exact Date)   BMI 23.69 kg/m   General Appearance:    Alert, cooperative, no distress, appears stated age  Head:    Normocephalic, without obvious abnormality, atraumatic  Eyes:    conjunctiva/corneas clear, EOM's intact, both eyes  Ears:    Normal external ear canals, both ears  Nose:   Nares normal, septum midline, mucosa normal, no drainage    or sinus tenderness  Throat:   Lips, mucosa, and tongue normal; teeth and gums normal  Neck:   Supple, symmetrical, trachea midline, no adenopathy;    thyroid:  no enlargement/tenderness/nodules  Back:     Symmetric, no curvature, ROM normal, no CVA tenderness  Lungs:     respirations unlabored  Chest Wall:    No  tenderness or deformity   Heart:    Regular rate and rhythm  Breast Exam:    No tenderness, masses, or nipple abnormality  Abdomen:     Soft, non-tender, bowel sounds active all four quadrants,    no masses, no organomegaly  Genitalia:    Normal female without lesion, discharge or tenderness   Uterus small mobile. Irreg contour. Possible small fibroids.   Extremities:   Extremities normal, atraumatic, no cyanosis or edema  Pulses:   2+ and symmetric all extremities  Skin:   Skin color, texture, turgor normal, no rashes or lesions     Assessment:    Healthy female exam.    Plan:   Virdia was seen today for gynecologic exam.  Diagnoses and all orders for this visit:  Well female exam with routine gynecological exam -     Cytology - PAP( [place])  Breast cancer screening by mammogram -     MM 3D SCREENING MAMMOGRAM BILATERAL BREAST; Future  Irregular periods/menstrual cycles -     US PELVIS TRANSVAGINAL NON-OB (TV ONLY); Future -     TSH Rfx on Abnormal to Free T4 -     Follicle stimulating hormone   F/u in 1 year or sooner prn   Eber Jones  Drucilla Schmidt, M.D., [name]

## 2023-04-03 LAB — FOLLICLE STIMULATING HORMONE: FSH: 6.5 m[IU]/mL

## 2023-04-03 LAB — TSH RFX ON ABNORMAL TO FREE T4: TSH: 1.99 u[IU]/mL (ref 0.450–4.500)

## 2023-04-08 LAB — CYTOLOGY - PAP
Comment: NEGATIVE
Diagnosis: NEGATIVE
High risk HPV: NEGATIVE

## 2023-04-14 ENCOUNTER — Encounter: Payer: Self-pay | Admitting: Obstetrics & Gynecology

## 2023-04-21 ENCOUNTER — Telehealth: Payer: Self-pay

## 2023-04-21 NOTE — Telephone Encounter (Signed)
Patient called requesting HSG results. Patient made aware that a message will be sent to the provider. Understanding was voiced. Adolphe Fortunato l Olubunmi Rothenberger, CMA

## 2023-05-01 ENCOUNTER — Telehealth: Payer: Self-pay | Admitting: Obstetrics & Gynecology

## 2023-05-01 NOTE — Telephone Encounter (Signed)
Attempted call to pt. Pt has appt next Wed.   Vanessa Daugherty, M.D., Evern Core

## 2023-05-07 ENCOUNTER — Encounter: Payer: Self-pay | Admitting: Obstetrics & Gynecology

## 2023-05-07 ENCOUNTER — Other Ambulatory Visit (HOSPITAL_BASED_OUTPATIENT_CLINIC_OR_DEPARTMENT_OTHER): Payer: Self-pay

## 2023-05-07 ENCOUNTER — Telehealth (INDEPENDENT_AMBULATORY_CARE_PROVIDER_SITE_OTHER): Payer: 59 | Admitting: Obstetrics & Gynecology

## 2023-05-07 DIAGNOSIS — N939 Abnormal uterine and vaginal bleeding, unspecified: Secondary | ICD-10-CM

## 2023-05-07 MED ORDER — MEGESTROL ACETATE 40 MG PO TABS
40.0000 mg | ORAL_TABLET | Freq: Two times a day (BID) | ORAL | 5 refills | Status: DC
Start: 2023-05-07 — End: 2023-07-15
  Filled 2023-05-07: qty 60, 15d supply, fill #0

## 2023-05-07 NOTE — Progress Notes (Signed)
TELEHEALTH GYNECOLOGY VISIT ENCOUNTER NOTE  Provider location: [facility]   Patient location: Home  I connected with Vanessa Daugherty on 05/07/23 at  1:10 PM EDT by telephone and verified that I am speaking with the correct person using two identifiers. Patient was unable to do MyChart audiovisual encounter due to technical difficulties, she tried several times.    I discussed the limitations, risks, security and privacy concerns of performing an evaluation and management service by telephone and the availability of in person appointments. I also discussed with the patient that there may be a patient responsible charge related to this service. The patient expressed understanding and agreed to proceed.   History:  Vanessa Daugherty is a 47 y.o. G29P2002 female being evaluated today for AUB.  Pts LMP was June 30 and lasted 13days. It was just spotting.   She denies any abnormal vaginal discharge. Pt reports that she is still bleeding monthly.  Pt denies pain with bleeding.        Past Medical History:  Diagnosis Date   Anemia    on meds   Seasonal allergies    Past Surgical History:  Procedure Laterality Date   NO PAST SURGERIES     The following portions of the patient's history were reviewed and updated as appropriate: allergies, current medications, past family history, past medical history, past social history, past surgical history and problem list.   Health Maintenance:  Normal pap and negative HRHPV on 04/02/2023.  Normal mammogram on 07/14/2022.   Review of Systems:  Pertinent items noted in HPI and remainder of comprehensive ROS otherwise negative.  Physical Exam:   General:  Alert, oriented and cooperative.   Mental Status: Normal mood and affect perceived. Normal judgment and thought content.  Physical exam deferred due to nature of the encounter  Labs and Imaging CLINICAL DATA:  Initial evaluation for irregular cycles.    EXAM: TRANSABDOMINAL AND TRANSVAGINAL ULTRASOUND OF PELVIS   TECHNIQUE: Both transabdominal and transvaginal ultrasound examinations of the pelvis were performed. Transabdominal technique was performed for global imaging of the pelvis including uterus, ovaries, adnexal regions, and pelvic cul-de-sac. It was necessary to proceed with endovaginal exam following the transabdominal exam to visualize the endometrium.   COMPARISON:  None Available.   FINDINGS: Uterus   Measurements: 6.8 x 5.6 x 6.0 cm = volume: 121 mL. Uterus is retroverted. 2.4 x 2.3 x 3.4 cm subserosal fibroid present at the anterior uterine body. 0.8 x 0.7 x 1.0 cm intramural fibroid present at the right fundus.   Endometrium   Thickness: 11 mm.  No focal abnormality visualized.   Right ovary   Measurements: 3.4 x 2.5 x 1.6 cm = volume: 6.9 mL. 1.4 cm complex cyst with peripheral vascularity, likely a degenerating corpus luteal cyst.   Left ovary   Measurements: 2.8 x 1.2 x 1.0 cm = volume: 1.8 mL. Normal appearance/no adnexal mass.   Other findings   Trace free fluid present within the pelvis.   IMPRESSION: 1. Endometrial stripe measures 11 mm in thickness. If bleeding remains unresponsive to hormonal or medical therapy, sonohysterogram should be considered for focal lesion work-up. (Ref: Radiological Reasoning: Algorithmic Workup of Abnormal Vaginal Bleeding with Endovaginal Sonography and Sonohysterography. AJR 2008; 161:W96-04). 2. Few small uterine fibroids measuring up to 3.4 cm as above. 3. 1.4 cm degenerating right ovarian corpus luteal cyst with associated trace free fluid within the pelvis.   Assessment and Plan:     AUB-  Next steps fu for Endo bx. Megace 40mg  bid   Patient desires surgical management with hysteroscopy with endometrial ablation.  The risks of surgery were discussed in detail with the patient including but not limited to: bleeding which may require transfusion or  reoperation; infection which may require prolonged hospitalization or re-hospitalization and antibiotic therapy; injury to bowel, bladder, ureters and major vessels or other surrounding organs; need for additional procedures including laparotomy; thromboembolic phenomenon, incisional problems and other postoperative or anesthesia complications.  Patient was told that the likelihood that her condition and symptoms will be treated effectively with this surgical management was very high; the postoperative expectations were also discussed in detail. The patient also understands the alternative treatment options which were discussed in full. All questions were answered.  She was told that she will be contacted by our surgical scheduler regarding the time and date of her surgery; routine preoperative instructions of having nothing to eat or drink after midnight on the day prior to surgery and also coming to the hospital 1 1/2 hours prior to her time of surgery were also emphasized.  She was told she may be called for a preoperative appointment about a week prior to surgery and will be given further preoperative instructions at that visit. Printed patient education handouts about the procedure were given to the patient to review at home.        I discussed the assessment and treatment plan with the patient. The patient was provided an opportunity to ask questions and all were answered. The patient agreed with the plan and demonstrated an understanding of the instructions.   The patient was advised to call back or seek an in-person evaluation/go to the ED if the symptoms worsen or if the condition fails to improve as anticipated.  I provided 17 minutes of non-face-to-face time during this encounter.   Willodean Rosenthal, MD [facility] [employer] [facility], [facility]

## 2023-05-07 NOTE — Patient Instructions (Signed)
Endometrial Ablation Endometrial ablation is a procedure that destroys the thin inner layer of the lining of the uterus (endometrium). This procedure may be done: To stop heavy menstrual periods. To stop bleeding that is causing anemia. To control irregular bleeding. To treat bleeding caused by small tumors (fibroids) in the endometrium. This procedure is often done as an alternative to major surgery, such as removal of the uterus and cervix (hysterectomy). As a result of this procedure: You may not be able to have children. However, if you have not yet gone through menopause: You may still have a small chance of getting pregnant. You will need to use a reliable method of birth control after the procedure to prevent pregnancy. You may stop having a menstrual period, or you may have only a small amount of bleeding during your period. Menstruation may return several years after the procedure. Tell a health care provider about: Any allergies you have. All medicines you are taking, including vitamins, herbs, eye drops, creams, and over-the-counter medicines. Any problems you or family members have had with the use of anesthetic medicines. Any blood disorders you have. Any surgeries you have had. Any medical conditions you have. Whether you are pregnant or may be pregnant. What are the risks? Generally, this is a safe procedure. However, problems may occur, including: A hole (perforation) in the uterus or bowel. Infection in the uterus, bladder, or vagina. Bleeding. Allergic reaction to medicines. Damage to nearby structures or organs. An air bubble in the lung (air embolus). Problems with pregnancy. Failure of the procedure. Decreased ability to diagnose cancer in the endometrium. Scar tissue forms after the procedure, making it more difficult to get a sample of the uterine lining. What happens before the procedure? Medicines Ask your health care provider about: Changing or stopping your  regular medicines. This is especially important if you take diabetes medicines or blood thinners. Taking medicines such as aspirin and ibuprofen. These medicines can thin your blood. Do not take these medicines before your procedure if your doctor tells you not to take them. Taking over-the-counter medicines, vitamins, herbs, and supplements. Tests You will have tests of your endometrium to make sure there are no precancerous cells or cancer cells present. You may have an ultrasound of the uterus. General instructions Do not use any products that contain nicotine or tobacco for at least 4 weeks before the procedure. These include cigarettes, chewing tobacco, and vaping devices, such as e-cigarettes. If you need help quitting, ask your health care provider. You may be given medicines to thin the endometrium. Ask your health care provider what steps will be taken to help prevent infection. These steps may include: Removing hair at the surgery site. Washing skin with a germ-killing soap. Taking antibiotic medicine. Plan to have a responsible adult take you home from the hospital or clinic. Plan to have a responsible adult care for you for the time you are told after you leave the hospital or clinic. This is important. What happens during the procedure?  You will lie on an exam table with your feet and legs supported as in a pelvic exam. An IV will be inserted into one of your veins. You will be given a medicine to help you relax (sedative). A surgical tool with a light and camera (resectoscope) will be inserted into your vagina and moved into your uterus. This allows your surgeon to see inside your uterus. Endometrial tissue will be destroyed and removed, using one of the following methods: Radiofrequency. This  uses an electrical current to destroy the endometrium. Cryotherapy. This uses extreme cold to freeze the endometrium. Heated fluid. This uses a heated salt and water (saline) solution to  destroy the endometrium. Microwave. This uses high-energy microwaves to heat up the endometrium and destroy it. Thermal balloon. This involves inserting a catheter with a balloon tip into the uterus. The balloon tip is filled with heated fluid to destroy the endometrium. The procedure may vary among health care providers and hospitals. What happens after the procedure? Your blood pressure, heart rate, breathing rate, and blood oxygen level will be monitored until you leave the hospital or clinic. You may have vaginal bleeding for 4-6 weeks after the procedure. You may also have: Cramps. A thin, watery vaginal discharge that is light pink or brown. A need to urinate more than usual. Nausea. If you were given a sedative during the procedure, it can affect you for several hours. Do not drive or operate machinery until your health care provider says that it is safe. Do not have sex or insert anything into your vagina until your health care provider says it is safe. Summary Endometrial ablation is done to treat many causes of heavy menstrual bleeding. The procedure destroys the thin inner layer of the lining of the uterus (endometrium). This procedure is often done as an alternative to major surgery, such as removal of the uterus and cervix (hysterectomy). Plan to have a responsible adult take you home from the hospital or clinic. This information is not intended to replace advice given to you by your health care provider. Make sure you discuss any questions you have with your health care provider. Document Revised: 04/13/2020 Document Reviewed: 04/13/2020 Elsevier Patient Education  2024 [employer] Endometrial Ablation, Care After The following information offers guidance on how to care for yourself after your procedure. Your health care provider may also give you more specific instructions. If you have problems or questions, contact your health care provider. What can I expect after the  procedure? After the procedure, it is common to have: A need to urinate more often than usual for the first 24 hours. Cramps that feel like menstrual cramps. These may last for 1-2 days. A thin, watery vaginal discharge that is light pink or brown. This may last for a few weeks. Discharge will be heavy for the first few days after your procedure. You may need to wear a sanitary pad. Nausea. Vaginal bleeding for 4-6 weeks after the procedure, as tissue healing occurs. Follow these instructions at home: Medicines  Take over-the-counter and prescription medicines only as told by your health care provider. If you were prescribed an antibiotic medicine, take it as told by your health care provider. Do not stop taking the antibiotic even if you start to feel better. Ask your health care provider if the medicine prescribed to you: Requires you to avoid driving or using machinery. Can cause constipation. You may need to take these actions to prevent or treat constipation: Drink enough fluid to keep your urine pale yellow. Take over-the-counter or prescription medicines. Eat foods that are high in fiber, such as beans, whole grains, and fresh fruits and vegetables. Limit foods that are high in fat and processed sugars, such as fried or sweet foods. Activity If you were given a sedative during the procedure, it can affect you for several hours. Do not drive or operate machinery until your health care provider says that it is safe. Do not have sex or put anything into  your vagina until your health care provider says that it is safe. Do not lift anything that is heavier than 5 lb (2.3 kg), or the limit that you are told, until your health care provider says that it is safe. Return to your normal activities as told by your health care provider. Ask your health care provider what activities are safe for you. General instructions Do not take baths, swim, or use a hot tub until your health care provider  says that it is safe. You will be able to take showers. Check your vaginal area every day for signs of infection. Check for: Redness, swelling, or more pain. More blood coming from your vagina. A bad-smelling discharge. Keep all follow-up visits. This is important. Contact a health care provider if: You have vaginal redness, swelling, or more pain. You have discharge or bleeding from your vagina that is getting worse. You have a bad-smelling vaginal discharge. You have a fever or chills. You have trouble urinating. Get help right away if: You have heavy, bright red vaginal bleeding that may include blood clots. You have severe cramps that do not get better with medicine. Summary After endometrial ablation, it is normal to have a thin, watery vaginal discharge that is light pink or brown. This may last a few weeks and may be heavier right after the procedure. Vaginal bleeding is common after the procedure and should get better with time. Check your vaginal area every day for signs of infection, such as a bad-smelling discharge. Keep all follow-up visits. This is important. This information is not intended to replace advice given to you by your health care provider. Make sure you discuss any questions you have with your health care provider. Document Revised: 04/13/2020 Document Reviewed: 04/13/2020 Elsevier Patient Education  2024 [employer].

## 2023-05-22 ENCOUNTER — Encounter (INDEPENDENT_AMBULATORY_CARE_PROVIDER_SITE_OTHER): Payer: Self-pay

## 2023-05-23 ENCOUNTER — Other Ambulatory Visit (HOSPITAL_BASED_OUTPATIENT_CLINIC_OR_DEPARTMENT_OTHER): Payer: Self-pay

## 2023-06-08 ENCOUNTER — Other Ambulatory Visit: Payer: Self-pay | Admitting: Family Medicine

## 2023-06-10 ENCOUNTER — Other Ambulatory Visit (HOSPITAL_BASED_OUTPATIENT_CLINIC_OR_DEPARTMENT_OTHER): Payer: Self-pay

## 2023-06-10 MED ORDER — PANTOPRAZOLE SODIUM 40 MG PO TBEC
40.0000 mg | DELAYED_RELEASE_TABLET | Freq: Every day | ORAL | 1 refills | Status: AC
  Filled 2023-06-10 – 2023-06-26 (×2): qty 90, 90d supply, fill #0

## 2023-06-16 ENCOUNTER — Other Ambulatory Visit: Payer: Self-pay | Admitting: Family Medicine

## 2023-06-16 ENCOUNTER — Other Ambulatory Visit (HOSPITAL_BASED_OUTPATIENT_CLINIC_OR_DEPARTMENT_OTHER): Payer: Self-pay

## 2023-06-16 MED ORDER — VITAMIN D 25 MCG (1000 UNIT) PO TABS
1000.0000 [IU] | ORAL_TABLET | Freq: Every day | ORAL | 0 refills | Status: AC
  Filled 2023-06-16 – 2023-06-26 (×2): qty 30, 30d supply, fill #0

## 2023-06-24 ENCOUNTER — Other Ambulatory Visit (HOSPITAL_BASED_OUTPATIENT_CLINIC_OR_DEPARTMENT_OTHER): Payer: Self-pay

## 2023-06-26 ENCOUNTER — Other Ambulatory Visit (HOSPITAL_COMMUNITY)
Admission: RE | Admit: 2023-06-26 | Discharge: 2023-06-26 | Disposition: A | Discharge status: Home / Self Care | Payer: 59 | Source: Ambulatory Visit | Attending: Family Medicine | Admitting: Family Medicine

## 2023-06-26 ENCOUNTER — Other Ambulatory Visit (HOSPITAL_BASED_OUTPATIENT_CLINIC_OR_DEPARTMENT_OTHER): Payer: Self-pay

## 2023-06-26 ENCOUNTER — Ambulatory Visit (INDEPENDENT_AMBULATORY_CARE_PROVIDER_SITE_OTHER): Payer: 59 | Admitting: Family Medicine

## 2023-06-26 ENCOUNTER — Encounter: Payer: Self-pay | Admitting: Family Medicine

## 2023-06-26 VITALS — BP 104/74 | HR 60 | Wt 137.0 lb

## 2023-06-26 DIAGNOSIS — N939 Abnormal uterine and vaginal bleeding, unspecified: Secondary | ICD-10-CM

## 2023-06-26 DIAGNOSIS — N840 Polyp of corpus uteri: Secondary | ICD-10-CM | POA: Diagnosis not present

## 2023-06-26 NOTE — Progress Notes (Signed)
ENDOMETRIAL BIOPSY     The indications for endometrial biopsy were reviewed.   Risks of the biopsy including cramping, bleeding, infection, uterine perforation, inadequate specimen and need for additional procedures  were discussed. The patient states she understands and agrees to undergo procedure today. Consent was signed. Time out was performed. Urine HCG was negative. A sterile speculum was placed in the patient's vagina and the cervix was prepped with Betadine. A cervical block was placed with lidocaine 2% with epinephrine 10mL. A single-toothed tenaculum was placed on the anterior lip of the cervix to stabilize it. The 3 mm pipelle was introduced into the endometrial cavity without difficulty to a depth of 8 cm, and a moderate amount of tissue was obtained and sent to pathology. The instruments were removed from the patient's vagina. Minimal bleeding from the cervix was noted. The patient tolerated the procedure well. Routine post-procedure instructions were given to the patient. The patient will follow up to review the results and for further management.

## 2023-06-27 LAB — SURGICAL PATHOLOGY

## 2023-06-30 ENCOUNTER — Encounter: Payer: 59 | Admitting: Family Medicine

## 2023-07-04 ENCOUNTER — Other Ambulatory Visit (HOSPITAL_BASED_OUTPATIENT_CLINIC_OR_DEPARTMENT_OTHER): Payer: Self-pay

## 2023-07-15 ENCOUNTER — Encounter: Payer: Self-pay | Admitting: Family Medicine

## 2023-07-15 ENCOUNTER — Ambulatory Visit (INDEPENDENT_AMBULATORY_CARE_PROVIDER_SITE_OTHER): Payer: 59 | Admitting: Family Medicine

## 2023-07-15 VITALS — BP 120/78 | HR 64 | Temp 98.6°F | Ht 63.0 in | Wt 137.2 lb

## 2023-07-15 DIAGNOSIS — Z0000 Encounter for general adult medical examination without abnormal findings: Secondary | ICD-10-CM | POA: Diagnosis not present

## 2023-07-15 DIAGNOSIS — Z23 Encounter for immunization: Secondary | ICD-10-CM | POA: Diagnosis not present

## 2023-07-15 DIAGNOSIS — L811 Chloasma: Secondary | ICD-10-CM | POA: Diagnosis not present

## 2023-07-15 NOTE — Progress Notes (Signed)
Chief Complaint  Patient presents with   Annual Exam     Well Woman Vanessa Daugherty is here for a complete physical.   Her last physical was >1 year ago.  Current diet: in general, a "healthy" diet. Current exercise: HIIT, walking. Weight is stable and she denies fatigue out of ordinary. Seatbelt? Yes Advanced directive? Yes  Health Maintenance Pap/HPV- Yes Mammogram- Yes CCS- Yes Tetanus- Due Hep C screening- Yes HIV screening- Yes  Past Medical History:  Diagnosis Date   Anemia    on meds   Seasonal allergies      Past Surgical History:  Procedure Laterality Date   NO PAST SURGERIES      Medications  Current Outpatient Medications on File Prior to Visit  Medication Sig Dispense Refill   cholecalciferol (VITAMIN D3) 25 MCG (1000 UNIT) tablet Take 1 tablet (1,000 Units total) by mouth daily. 30 tablet 0   MAGNESIUM PO Take 1 tablet by mouth daily as needed.     pantoprazole (PROTONIX) 40 MG tablet Take 1 tablet (40 mg total) by mouth daily. 90 tablet 1   iron polysaccharides (NIFEREX) 150 MG capsule TAKE 1 CAPSULE (150 MG TOTAL) BY MOUTH DAILY. 100 capsule 8   No current facility-administered medications on file prior to visit.     Allergies No Known Allergies  Review of Systems: Constitutional:  no unexpected weight changes Eye:  no recent significant change in vision Ear/Nose/Mouth/Throat:  Ears:  no recent change in hearing Nose/Mouth/Throat:  no complaints of nasal congestion, no sore throat Cardiovascular: no chest pain Respiratory:  no shortness of breath Gastrointestinal:  no abdominal pain, no change in bowel habits GU:  Female: negative for dysuria or pelvic pain Musculoskeletal/Extremities:  no new pain of the joints Integumentary (Skin/Breast): + Rash on face Neurologic:  no headaches Endocrine:  denies fatigue Hematologic/Lymphatic:  No areas of easy bleeding  Exam BP 120/78 (BP Location: Left Arm, Patient Position: Sitting, Cuff Size:  Normal)   Pulse 64   Temp 98.6 F (37 C) (Oral)   Ht 5\' 3"  (1.6 m)   Wt 137 lb 4 oz (62.3 kg)   LMP 04/18/2023 (Exact Date)   SpO2 96%   BMI 24.31 kg/m  General:  well developed, well nourished, in no apparent distress Skin: Hyperpigmented patches on both cheeks; otherwise no significant moles, warts, or growths Head:  no masses, lesions, or tenderness Eyes:  pupils equal and round, sclera anicteric without injection Ears:  canals without lesions, TMs shiny without retraction, no obvious effusion, no erythema Nose:  nares patent, mucosa normal, and no drainage Throat/Pharynx:  lips and gingiva without lesion; tongue and uvula midline; non-inflamed pharynx; no exudates or postnasal drainage Neck: neck supple without adenopathy, thyromegaly, or masses Lungs:  clear to auscultation, breath sounds equal bilaterally, no respiratory distress Cardio:  regular rate and rhythm, no LE edema Abdomen:  abdomen soft, nontender; bowel sounds normal; no masses or organomegaly Genital: Defer to GYN Musculoskeletal:  symmetrical muscle groups noted without atrophy or deformity Extremities:  no clubbing, cyanosis, or edema, no deformities, no skin discoloration Neuro:  gait normal; deep tendon reflexes normal and symmetric Psych: well oriented with normal range of affect and appropriate judgment/insight  Assessment and Plan  Well adult exam - Plan: CBC, Comprehensive metabolic panel, Lipid panel, Hemoglobin A1c, VITAMIN D 25 Hydroxy (Vit-D Deficiency, Fractures), IBC + Ferritin  Melasma - Plan: Ambulatory referral to Dermatology  Need for Tdap vaccination - Plan: Tdap vaccine greater than or equal  to 7yo IM   Well 47 y.o. female. Counseled on diet and exercise. Advanced directive form provided today.  Tdap today.  She thinks she may have some sort of fibromyalgia.  Offered medication and physical therapy which she politely declined.  Yoga, anti-inflammatory dietary options provided, she will let  me know if she changes her mind with any treatment options. Melasma: Refer to dermatology. Other orders as above. Follow up in 1 year. The patient voiced understanding and agreement to the plan.  Jilda Roche Sallis, DO 07/15/23 2:16 PM

## 2023-07-15 NOTE — Patient Instructions (Addendum)
Give us 2-3 business days to get the results of your labs back.  ? ?Keep the diet clean and stay active. ? ?Please get me a copy of your advanced directive form at your convenience.  ? ?Foods that may reduce pain: ?1) Ginger ?2) Blueberries ?3) Salmon ?4) Pumpkin seeds ?5) dark chocolate ?6) turmeric ?7) tart cherries ?8) virgin olive oil ?9) chilli peppers ?10) mint ?11) krill oil ? ?Let us know if you need anything. ?

## 2023-07-16 LAB — CBC
HCT: 40.5 % (ref 36.0–46.0)
Hemoglobin: 13.4 g/dL (ref 12.0–15.0)
MCHC: 33.1 g/dL (ref 30.0–36.0)
MCV: 92.6 fL (ref 78.0–100.0)
Platelets: 239 10*3/uL (ref 150.0–400.0)
RBC: 4.37 Mil/uL (ref 3.87–5.11)
RDW: 13.7 % (ref 11.5–15.5)
WBC: 5.8 10*3/uL (ref 4.0–10.5)

## 2023-07-16 LAB — COMPREHENSIVE METABOLIC PANEL
ALT: 14 U/L (ref 0–35)
AST: 14 U/L (ref 0–37)
Albumin: 4.5 g/dL (ref 3.5–5.2)
Alkaline Phosphatase: 59 U/L (ref 39–117)
BUN: 8 mg/dL (ref 6–23)
CO2: 27 meq/L (ref 19–32)
Calcium: 9.7 mg/dL (ref 8.4–10.5)
Chloride: 103 meq/L (ref 96–112)
Creatinine, Ser: 0.58 mg/dL (ref 0.40–1.20)
GFR: 108.49 mL/min (ref >60.00)
Glucose, Bld: 85 mg/dL (ref 70–99)
Potassium: 4 meq/L (ref 3.5–5.1)
Sodium: 137 meq/L (ref 135–145)
Total Bilirubin: 0.9 mg/dL (ref 0.2–1.2)
Total Protein: 7 g/dL (ref 6.0–8.3)

## 2023-07-16 LAB — IBC + FERRITIN
Ferritin: 23.8 ng/mL (ref 10.0–291.0)
Iron: 97 ug/dL (ref 42–145)
Saturation Ratios: 22.8 % (ref 20.0–50.0)
TIBC: 425.6 ug/dL (ref 250.0–450.0)
Transferrin: 304 mg/dL (ref 212.0–360.0)

## 2023-07-16 LAB — HEMOGLOBIN A1C: Hgb A1c MFr Bld: 5.5 % (ref 4.6–6.5)

## 2023-07-16 LAB — LIPID PANEL
Cholesterol: 236 mg/dL — ABNORMAL HIGH (ref 0–200)
HDL: 50.8 mg/dL (ref >39.00)
LDL Cholesterol: 147 mg/dL — ABNORMAL HIGH (ref 0–99)
NonHDL: 184.91
Total CHOL/HDL Ratio: 5
Triglycerides: 191 mg/dL — ABNORMAL HIGH (ref 0.0–149.0)
VLDL: 38.2 mg/dL (ref 0.0–40.0)

## 2023-07-16 LAB — VITAMIN D 25 HYDROXY (VIT D DEFICIENCY, FRACTURES): VITD: 34.57 ng/mL (ref 30.00–100.00)

## 2023-07-29 ENCOUNTER — Other Ambulatory Visit (HOSPITAL_BASED_OUTPATIENT_CLINIC_OR_DEPARTMENT_OTHER): Payer: Self-pay

## 2023-07-29 ENCOUNTER — Ambulatory Visit (HOSPITAL_BASED_OUTPATIENT_CLINIC_OR_DEPARTMENT_OTHER)
Admission: RE | Admit: 2023-07-29 | Discharge: 2023-07-29 | Disposition: A | Discharge status: Home / Self Care | Payer: 59 | Source: Ambulatory Visit | Attending: Sports Medicine | Admitting: Sports Medicine

## 2023-07-29 ENCOUNTER — Encounter: Payer: Self-pay | Admitting: Sports Medicine

## 2023-07-29 ENCOUNTER — Ambulatory Visit (INDEPENDENT_AMBULATORY_CARE_PROVIDER_SITE_OTHER): Payer: 59 | Admitting: Sports Medicine

## 2023-07-29 VITALS — BP 120/80 | Ht 63.0 in | Wt 137.0 lb

## 2023-07-29 DIAGNOSIS — M545 Low back pain, unspecified: Secondary | ICD-10-CM | POA: Insufficient documentation

## 2023-07-29 DIAGNOSIS — W19XXXA Unspecified fall, initial encounter: Secondary | ICD-10-CM | POA: Diagnosis not present

## 2023-07-29 DIAGNOSIS — M48061 Spinal stenosis, lumbar region without neurogenic claudication: Secondary | ICD-10-CM | POA: Diagnosis not present

## 2023-07-29 DIAGNOSIS — M47816 Spondylosis without myelopathy or radiculopathy, lumbar region: Secondary | ICD-10-CM | POA: Diagnosis not present

## 2023-07-29 MED ORDER — MELOXICAM 15 MG PO TABS
15.0000 mg | ORAL_TABLET | Freq: Every day | ORAL | 0 refills | Status: DC
  Filled 2023-07-29: qty 40, 40d supply, fill #0

## 2023-07-29 NOTE — Progress Notes (Signed)
   Subjective:    Patient ID: Vanessa Daugherty, female    DOB: 23-Apr-1976, 47 y.o.   MRN: [id number]  HPI chief complaint: Low back pain  Patient presents today complaining of right-sided low back pain that began 2 days ago when she fell at a [employer].  She slipped in some water on the floor and landed directly on her low back.  Since then she has had right-sided low back pain which will occasionally radiate into the right leg.  She also endorses intermittent numbness and tingling in the right foot.  She is tried ibuprofen, ice, and massage.  She denies any problems with her low back in the past.  No low back surgeries.  Past medical history reviewed Medications reviewed Allergies reviewed    Review of Systems As above    Objective:   Physical Exam  Well-developed, well-nourished.  No acute distress  Lumbar spine: Good lumbar flexion.  There is pain with extension.  No tenderness to palpation along the lumbar midline but there is some pain and spasm along the right paraspinal musculature.  Neurological exam: Reflexes are equal at the Achilles and patellar tendons bilaterally.  Strength is 5/5 in both lower extremities.      Assessment & Plan:   Low back pain and right leg radiculopathy likely secondary to contusion  We will order x-rays of the lumbar spine to rule out obvious fracture.  Trial of Mobic 15 mg with food for the next 7 days then as needed afterwards.  Follow-up with me in 2 weeks for reevaluation.  If x-rays are unremarkable but symptoms persist then we may need to consider merits of MRI at that time.  This note was dictated using Dragon naturally speaking software and may contain errors in syntax, spelling, or content which have not been identified prior to signing this note.

## 2023-08-12 ENCOUNTER — Ambulatory Visit (INDEPENDENT_AMBULATORY_CARE_PROVIDER_SITE_OTHER): Payer: 59 | Admitting: Sports Medicine

## 2023-08-12 ENCOUNTER — Encounter: Payer: Self-pay | Admitting: Sports Medicine

## 2023-08-12 VITALS — BP 112/78 | Ht 63.0 in | Wt 137.0 lb

## 2023-08-12 DIAGNOSIS — M5440 Lumbago with sciatica, unspecified side: Secondary | ICD-10-CM

## 2023-08-12 NOTE — Progress Notes (Signed)
   Subjective:    Patient ID: Vanessa Daugherty, female    DOB: [birth year], 47 y.o.   MRN: [id number]  HPI  Patient presents today for follow-up on right-sided low back pain that began after a slip and fall.  She is still having right-sided low back pain.  Difficulty with lumbar flexion and extension.  Occasional numbness and tingling into her right foot.  Meloxicam has been helpful but not curative.  Previous x-rays showed no obvious fracture.   Review of Systems As above    Objective:   Physical Exam  Well-developed, well-nourished.  Lumbar spine: There is some tenderness to palpation diffusely along the right side of the lower lumbar spine.  Limited lumbar flexion and extension secondary to pain.  No gross focal neurological deficit of either lower extremity.  X-rays of the lumbar spine are as above      Assessment & Plan:   Right-sided low back pain and radiculopathy status post fall-rule out lumbar disc herniation  MRI of the lumbar spine specifically to rule out a lumbar disc herniation.  Will follow-up with her via MyChart with those results when available.  We will delineate further treatment based on those findings.  In the meantime, she will continue with her meloxicam as needed for pain.  This note was dictated using Dragon naturally speaking software and may contain errors in syntax, spelling, or content which have not been identified prior to signing this note.

## 2023-08-14 ENCOUNTER — Ambulatory Visit (HOSPITAL_COMMUNITY): Payer: 59

## 2023-08-14 ENCOUNTER — Ambulatory Visit (HOSPITAL_COMMUNITY)
Admission: RE | Admit: 2023-08-14 | Discharge: 2023-08-14 | Disposition: A | Discharge status: Home / Self Care | Payer: 59 | Source: Ambulatory Visit | Attending: Sports Medicine | Admitting: Sports Medicine

## 2023-08-14 DIAGNOSIS — M48061 Spinal stenosis, lumbar region without neurogenic claudication: Secondary | ICD-10-CM | POA: Diagnosis not present

## 2023-08-14 DIAGNOSIS — M5116 Intervertebral disc disorders with radiculopathy, lumbar region: Secondary | ICD-10-CM | POA: Diagnosis not present

## 2023-08-14 DIAGNOSIS — M5117 Intervertebral disc disorders with radiculopathy, lumbosacral region: Secondary | ICD-10-CM | POA: Diagnosis not present

## 2023-08-14 DIAGNOSIS — M4726 Other spondylosis with radiculopathy, lumbar region: Secondary | ICD-10-CM | POA: Diagnosis not present

## 2023-08-14 DIAGNOSIS — M5440 Lumbago with sciatica, unspecified side: Secondary | ICD-10-CM | POA: Diagnosis not present

## 2023-08-20 ENCOUNTER — Encounter: Payer: Self-pay | Admitting: Sports Medicine

## 2023-08-26 ENCOUNTER — Encounter: Payer: Self-pay | Admitting: Sports Medicine

## 2023-08-29 ENCOUNTER — Other Ambulatory Visit: Payer: Self-pay | Admitting: *Deleted

## 2023-08-29 DIAGNOSIS — M51369 Other intervertebral disc degeneration, lumbar region without mention of lumbar back pain or lower extremity pain: Secondary | ICD-10-CM

## 2023-08-29 DIAGNOSIS — M5440 Lumbago with sciatica, unspecified side: Secondary | ICD-10-CM

## 2023-09-02 ENCOUNTER — Other Ambulatory Visit: Payer: Self-pay | Admitting: Family Medicine

## 2023-09-02 ENCOUNTER — Encounter: Payer: Self-pay | Admitting: Family Medicine

## 2023-09-02 ENCOUNTER — Other Ambulatory Visit (HOSPITAL_BASED_OUTPATIENT_CLINIC_OR_DEPARTMENT_OTHER): Payer: Self-pay

## 2023-09-02 DIAGNOSIS — R790 Abnormal level of blood mineral: Secondary | ICD-10-CM

## 2023-09-02 MED ORDER — FLUOCIN-HYDROQUINONE-TRETINOIN 0.01-4-0.05 % EX CREA
TOPICAL_CREAM | CUTANEOUS | 0 refills | Status: AC
  Filled 2023-09-02: qty 30, 30d supply, fill #0

## 2023-09-03 ENCOUNTER — Other Ambulatory Visit (HOSPITAL_BASED_OUTPATIENT_CLINIC_OR_DEPARTMENT_OTHER): Payer: Self-pay

## 2023-09-12 ENCOUNTER — Other Ambulatory Visit: Payer: Self-pay | Admitting: Family Medicine

## 2023-09-12 ENCOUNTER — Other Ambulatory Visit (HOSPITAL_BASED_OUTPATIENT_CLINIC_OR_DEPARTMENT_OTHER): Payer: Self-pay

## 2023-09-12 ENCOUNTER — Other Ambulatory Visit (INDEPENDENT_AMBULATORY_CARE_PROVIDER_SITE_OTHER): Payer: 59

## 2023-09-12 DIAGNOSIS — R790 Abnormal level of blood mineral: Secondary | ICD-10-CM | POA: Diagnosis not present

## 2023-09-12 MED ORDER — POLYSACCHARIDE IRON COMPLEX 150 MG PO CAPS
150.0000 mg | ORAL_CAPSULE | Freq: Every day | ORAL | 8 refills | Status: DC
  Filled 2023-09-12 – 2023-11-20 (×2): qty 100, 100d supply, fill #0
  Filled 2023-11-20: qty 30, 30d supply, fill #0

## 2023-09-13 ENCOUNTER — Encounter: Payer: Self-pay | Admitting: Family Medicine

## 2023-09-13 DIAGNOSIS — D500 Iron deficiency anemia secondary to blood loss (chronic): Secondary | ICD-10-CM

## 2023-09-13 LAB — IRON,TIBC AND FERRITIN PANEL
%SAT: 32 % (ref 16–45)
Ferritin: 30 ng/mL (ref 16–232)
Iron: 125 ug/dL (ref 40–190)
TIBC: 391 ug/dL (ref 250–450)

## 2023-09-15 ENCOUNTER — Other Ambulatory Visit: Payer: Self-pay

## 2023-09-15 DIAGNOSIS — R790 Abnormal level of blood mineral: Secondary | ICD-10-CM | POA: Insufficient documentation

## 2023-09-15 NOTE — Telephone Encounter (Signed)
Per PCP, Monoferric ordered at [facility]. Infusion clinic to contact patient to schedule.

## 2023-09-18 ENCOUNTER — Telehealth: Payer: Self-pay | Admitting: Pharmacy Technician

## 2023-09-18 DIAGNOSIS — H524 Presbyopia: Secondary | ICD-10-CM | POA: Diagnosis not present

## 2023-09-18 NOTE — Telephone Encounter (Addendum)
Auth Submission: APPROVED Site of care: Site of care: [facility] Payer: [employer] Medication & CPT/J Code(s) submitted: Monoferric (Ferrci derisomaltose) [phone] Route of submission (phone, fax, portal): PORTAL Phone # Fax # Auth type: Buy/Bill PB Units/visits requested: X1 Reference number: [id number] Approval from: 09/16/23 to 03/14/24   Monoferric co-pay card: Approved ID: [id number]

## 2023-09-22 ENCOUNTER — Other Ambulatory Visit (HOSPITAL_BASED_OUTPATIENT_CLINIC_OR_DEPARTMENT_OTHER): Payer: Self-pay

## 2023-09-23 ENCOUNTER — Ambulatory Visit: Payer: 59

## 2023-09-23 VITALS — BP 138/92 | HR 70 | Temp 98.4°F | Resp 16 | Ht 64.0 in | Wt 140.0 lb

## 2023-09-23 DIAGNOSIS — D509 Iron deficiency anemia, unspecified: Secondary | ICD-10-CM

## 2023-09-23 DIAGNOSIS — R790 Abnormal level of blood mineral: Secondary | ICD-10-CM

## 2023-09-23 MED ORDER — DIPHENHYDRAMINE HCL 50 MG/ML IJ SOLN: 25.0000 mg | Freq: Once | INTRAMUSCULAR | Status: DC

## 2023-09-23 MED ORDER — SODIUM CHLORIDE 0.9 % IV SOLN
1000.0000 mg | Freq: Once | INTRAVENOUS | Status: AC
  Administered 2023-09-23: 1000 mg via INTRAVENOUS
  Filled 2023-09-23: qty 10

## 2023-09-23 MED ORDER — ACETAMINOPHEN 325 MG PO TABS: 650.0000 mg | ORAL_TABLET | Freq: Once | ORAL | Status: DC

## 2023-09-23 NOTE — Progress Notes (Signed)
Diagnosis: Acute Anemia  Provider:  Chilton Greathouse MD  Procedure: IV Infusion  IV Type: Peripheral, IV Location: L Antecubital  Monoferric (Ferric Derisomaltose), Dose: 1000 mg  Infusion Start Time: 1001  Infusion Stop Time: 1025  Post Infusion IV Care: Patient declined observation and Peripheral IV Discontinued  Discharge: Condition: Good, Destination: Home . AVS Declined  Performed by:  Nat Math, RN

## 2023-09-25 ENCOUNTER — Ambulatory Visit: Payer: 59 | Admitting: Orthopedic Surgery

## 2023-09-25 ENCOUNTER — Other Ambulatory Visit (INDEPENDENT_AMBULATORY_CARE_PROVIDER_SITE_OTHER): Payer: 59

## 2023-09-25 VITALS — BP 118/80 | HR 87 | Ht 63.0 in | Wt 137.0 lb

## 2023-09-25 DIAGNOSIS — M5416 Radiculopathy, lumbar region: Secondary | ICD-10-CM | POA: Diagnosis not present

## 2023-09-25 DIAGNOSIS — M545 Low back pain, unspecified: Secondary | ICD-10-CM | POA: Diagnosis not present

## 2023-09-25 NOTE — Progress Notes (Signed)
Orthopedic Spine Surgery Office Note  Assessment: Patient is a 47 y.o. female with low back pain that radiates into the right lateral hip.  Has lateral recess stenosis at L4/5 could be causing radiculopathy   Plan: -Explained that initially conservative treatment is tried as a significant number of patients may experience relief with these treatment modalities. Discussed that the conservative treatments include:  -activity modification  -physical therapy  -over the counter pain medications  -medrol dosepak  -lumbar steroid injections -Patient has tried heat, tylenol, meloxicam  -Recommended diagnostic/therapeutic L5 transforaminal injection -Patient should return to office in 4 weeks, x-rays at next visit: none   Patient expressed understanding of the plan and all questions were answered to the patient's satisfaction.   ___________________________________________________________________________   History:  Patient is a 47 y.o. female who presents today for lumbar spine.  Patient has a long history of back pain that would come and go.  She was not having any pain but then had a fall at [employer] and noted acute onset of low back pain.  She feels the pain radiates into her right lateral hip.  It does not radiate past the hip.  She does get some numbness along the lateral aspect of the thigh and feels numbness in the toes of the right foot.  She has no pain rating into the left lower extremity.  Pain has not been getting better with time.   Weakness: Yes, right leg feels weaker.  No other weakness noted Symptoms of imbalance: Denies Paresthesias and numbness: Yes, has decreased sensation over the lateral aspect of her right hip and in her right toes.  No other numbness or paresthesias Bowel or bladder incontinence: Denies Saddle anesthesia: Denies  Treatments tried: heat, tylenol, meloxicam  Review of systems: Denies fevers and chills, night sweats, unexplained weight loss, history of  cancer, pain that wakes her at night  Past medical history: HLD  Allergies: NKDA  Past surgical history:  None  Social history: Denies use of nicotine product (smoking, vaping, patches, smokeless) Alcohol use: Denies Denies recreational drug use   Physical Exam:  BMI of 24.3  General: no acute distress, appears stated age Neurologic: alert, answering questions appropriately, following commands Respiratory: unlabored breathing on room air, symmetric chest rise Psychiatric: appropriate affect, normal cadence to speech   MSK (spine):  -Strength exam      Left  Right EHL    5/5  5/5 TA    5/5  5/5 GSC    5/5  5/5 Knee extension  5/5  5/5 Hip flexion   5/5  5/5  -Sensory exam    Sensation intact to light touch in L3-S1 nerve distributions of bilateral lower extremities  -Achilles DTR: 1/4 on the left, 1/4 on the right -Patellar tendon DTR: 1/4 on the left, 1/4 on the right  -Straight leg raise: negative bilaterally -Femoral nerve stretch test: negative bilaterally -Clonus: no beats bilaterally  -Left hip exam: No pain through range of motion, negative Stinchfield, negative FABER -Right hip exam: No pain through range of motion, negative Stinchfield, negative FABER  Imaging: XRs of the lumbar spine from 09/25/2023 was independently reviewed and interpreted, showing disc height loss at L3/4.  No other significant degenerative changes seen.  No evidence of instability on flexion/extension views.  No fracture or dislocation seen.  MRI of the lumbar spine from 08/14/2023 was independently reviewed and interpreted, showing degenerative disc disease at L3/4.  Facet arthropathy with bilateral lateral recess stenosis at L4/5.  No other  significant stenosis seen.   Patient name: Vanessa Daugherty Patient MRN: [id number] Date of visit: 09/25/23

## 2023-09-29 ENCOUNTER — Telehealth: Payer: Self-pay | Admitting: Physical Medicine and Rehabilitation

## 2023-09-29 NOTE — Telephone Encounter (Signed)
Pt called requesting a call to set an appt for back injection with [facility]. Pt is asking for update for approval for back injection. Please call pt ASAP at [phone].

## 2023-10-02 ENCOUNTER — Encounter: Payer: Self-pay | Admitting: Orthopedic Surgery

## 2023-10-02 DIAGNOSIS — M5416 Radiculopathy, lumbar region: Secondary | ICD-10-CM

## 2023-10-06 ENCOUNTER — Inpatient Hospital Stay
Admission: RE | Admit: 2023-10-06 | Discharge: 2023-10-06 | Disposition: A | Discharge status: Home / Self Care | Payer: 59 | Source: Ambulatory Visit | Attending: Physical Medicine and Rehabilitation | Admitting: Physical Medicine and Rehabilitation

## 2023-10-06 DIAGNOSIS — M5416 Radiculopathy, lumbar region: Secondary | ICD-10-CM | POA: Diagnosis not present

## 2023-10-06 DIAGNOSIS — M48061 Spinal stenosis, lumbar region without neurogenic claudication: Secondary | ICD-10-CM | POA: Diagnosis not present

## 2023-10-06 DIAGNOSIS — M9963 Osseous and subluxation stenosis of intervertebral foramina of lumbar region: Secondary | ICD-10-CM | POA: Diagnosis not present

## 2023-10-06 MED ORDER — IOPAMIDOL (ISOVUE-M 200) INJECTION 41%
1.0000 mL | Freq: Once | INTRAMUSCULAR | Status: AC
  Administered 2023-10-06: 1 mL via EPIDURAL

## 2023-10-06 MED ORDER — METHYLPREDNISOLONE ACETATE 40 MG/ML INJ SUSP (RADIOLOG
80.0000 mg | Freq: Once | INTRAMUSCULAR | Status: AC
  Administered 2023-10-06: 80 mg via EPIDURAL

## 2023-10-06 NOTE — Discharge Instructions (Signed)

## 2023-11-20 ENCOUNTER — Other Ambulatory Visit (HOSPITAL_BASED_OUTPATIENT_CLINIC_OR_DEPARTMENT_OTHER): Payer: Self-pay

## 2023-11-20 ENCOUNTER — Encounter: Payer: Self-pay | Admitting: Family Medicine

## 2023-12-03 ENCOUNTER — Encounter: Payer: Self-pay | Admitting: Family Medicine

## 2023-12-03 DIAGNOSIS — D500 Iron deficiency anemia secondary to blood loss (chronic): Secondary | ICD-10-CM | POA: Insufficient documentation

## 2024-06-01 ENCOUNTER — Ambulatory Visit: Admitting: Dermatology

## 2024-07-20 ENCOUNTER — Other Ambulatory Visit (HOSPITAL_COMMUNITY): Payer: Self-pay

## 2024-07-20 ENCOUNTER — Other Ambulatory Visit: Payer: Self-pay

## 2024-07-20 ENCOUNTER — Encounter: Payer: Self-pay | Admitting: Family Medicine

## 2024-07-20 ENCOUNTER — Ambulatory Visit (INDEPENDENT_AMBULATORY_CARE_PROVIDER_SITE_OTHER): Admitting: Family Medicine

## 2024-07-20 ENCOUNTER — Other Ambulatory Visit (HOSPITAL_BASED_OUTPATIENT_CLINIC_OR_DEPARTMENT_OTHER): Payer: Self-pay

## 2024-07-20 ENCOUNTER — Ambulatory Visit: Payer: Self-pay | Admitting: Family Medicine

## 2024-07-20 VITALS — BP 126/74 | HR 61 | Temp 97.5°F | Resp 16 | Ht 63.0 in | Wt 141.0 lb

## 2024-07-20 DIAGNOSIS — Z0000 Encounter for general adult medical examination without abnormal findings: Secondary | ICD-10-CM

## 2024-07-20 DIAGNOSIS — G8929 Other chronic pain: Secondary | ICD-10-CM

## 2024-07-20 DIAGNOSIS — E782 Mixed hyperlipidemia: Secondary | ICD-10-CM

## 2024-07-20 DIAGNOSIS — E611 Iron deficiency: Secondary | ICD-10-CM

## 2024-07-20 DIAGNOSIS — M549 Dorsalgia, unspecified: Secondary | ICD-10-CM

## 2024-07-20 LAB — CBC
HCT: 40.1 % (ref 36.0–46.0)
Hemoglobin: 13.3 g/dL (ref 12.0–15.0)
MCHC: 33.3 g/dL (ref 30.0–36.0)
MCV: 91.3 fl (ref 78.0–100.0)
Platelets: 192 K/uL (ref 150.0–400.0)
RBC: 4.39 Mil/uL (ref 3.87–5.11)
RDW: 12.9 % (ref 11.5–15.5)
WBC: 5.7 K/uL (ref 4.0–10.5)

## 2024-07-20 LAB — SEDIMENTATION RATE: Sed Rate: 3 mm/h (ref 0–20)

## 2024-07-20 LAB — COMPREHENSIVE METABOLIC PANEL WITH GFR
ALT: 14 U/L (ref 0–35)
AST: 13 U/L (ref 0–37)
Albumin: 4.7 g/dL (ref 3.5–5.2)
Alkaline Phosphatase: 62 U/L (ref 39–117)
BUN: 10 mg/dL (ref 6–23)
CO2: 30 meq/L (ref 19–32)
Calcium: 9.4 mg/dL (ref 8.4–10.5)
Chloride: 100 meq/L (ref 96–112)
Creatinine, Ser: 0.67 mg/dL (ref 0.40–1.20)
GFR: 104.04 mL/min (ref >60.00)
Glucose, Bld: 98 mg/dL (ref 70–99)
Potassium: 4.3 meq/L (ref 3.5–5.1)
Sodium: 137 meq/L (ref 135–145)
Total Bilirubin: 1.4 mg/dL — ABNORMAL HIGH (ref 0.2–1.2)
Total Protein: 7.3 g/dL (ref 6.0–8.3)

## 2024-07-20 LAB — IBC + FERRITIN
Ferritin: 128.4 ng/mL (ref 10.0–291.0)
Iron: 174 ug/dL — ABNORMAL HIGH (ref 42–145)
Saturation Ratios: 44.9 % (ref 20.0–50.0)
TIBC: 387.8 ug/dL (ref 250.0–450.0)
Transferrin: 277 mg/dL (ref 212.0–360.0)

## 2024-07-20 LAB — LIPID PANEL
Cholesterol: 213 mg/dL — ABNORMAL HIGH (ref 0–200)
HDL: 45.1 mg/dL (ref >39.00)
LDL Cholesterol: 118 mg/dL — ABNORMAL HIGH (ref 0–99)
NonHDL: 167.72
Total CHOL/HDL Ratio: 5
Triglycerides: 247 mg/dL — ABNORMAL HIGH (ref 0.0–149.0)
VLDL: 49.4 mg/dL — ABNORMAL HIGH (ref 0.0–40.0)

## 2024-07-20 LAB — HEMOGLOBIN A1C: Hgb A1c MFr Bld: 5.9 % (ref 4.6–6.5)

## 2024-07-20 LAB — VITAMIN D 25 HYDROXY (VIT D DEFICIENCY, FRACTURES): VITD: 31.01 ng/mL (ref 30.00–100.00)

## 2024-07-20 MED ORDER — POLYSACCHARIDE IRON COMPLEX 150 MG PO CAPS
150.0000 mg | ORAL_CAPSULE | Freq: Every day | ORAL | 8 refills | Status: AC
  Filled 2024-07-20: qty 100, 100d supply, fill #0

## 2024-07-20 MED ORDER — POLYSACCHARIDE IRON COMPLEX 150 MG PO CAPS
150.0000 mg | ORAL_CAPSULE | Freq: Every day | ORAL | 8 refills | Status: DC
  Filled 2024-07-20: qty 100, 100d supply, fill #0

## 2024-07-20 NOTE — Progress Notes (Signed)
 Chief Complaint  Patient presents with   Annual Exam    CPE     Well Woman Vanessa Daugherty is here for a complete physical.   Her last physical was >1 year ago.  Current diet: in general, a healthy diet. Current exercise: Yoga. Weight is stable and she confirms fatigue out of ordinary. Seatbelt? Yes Advanced directive? No  Health Maintenance Pap/HPV- Yes Mammogram- Yes Tetanus- Yes Hep C screening- Yes HIV screening- Yes  Past Medical History:  Diagnosis Date   Anemia    on meds   Seasonal allergies      Past Surgical History:  Procedure Laterality Date   NO PAST SURGERIES      Medications  Current Outpatient Medications on File Prior to Visit  Medication Sig Dispense Refill   cholecalciferol  (VITAMIN D3) 25 MCG (1000 UNIT) tablet Take 1 tablet (1,000 Units total) by mouth daily. 30 tablet 0   Fluocin-Hydroquinone -Tretinoin  0.01-4-0.05 % CREA Apply thin layer to affect area daily. 30 g 0   MAGNESIUM PO Take 1 tablet by mouth daily as needed.     pantoprazole  (PROTONIX ) 40 MG tablet Take 1 tablet (40 mg total) by mouth daily. 90 tablet 1   No current facility-administered medications on file prior to visit.     Allergies No Known Allergies  Review of Systems: Constitutional:  no unexpected weight changes Eye:  no recent significant change in vision Ear/Nose/Mouth/Throat:  Ears:  no recent change in hearing Nose/Mouth/Throat:  no complaints of nasal congestion, no sore throat Cardiovascular: no chest pain Respiratory:  no shortness of breath Gastrointestinal:  no abdominal pain, no change in bowel habits GU:  Female: negative for dysuria or pelvic pain Musculoskeletal/Extremities:  no new pain of the joints Integumentary (Skin/Breast):  no abnormal skin lesions reported Neurologic:  no headaches Endocrine:  denies fatigue Hematologic/Lymphatic:  No areas of easy bleeding  Exam BP 126/74 (BP Location: Left Arm, Patient Position: Sitting)   Pulse 61    Temp (!) 97.5 F (36.4 C) (Oral)   Resp 16   Ht 5' 3 (1.6 m)   Wt 141 lb (64 kg)   SpO2 96%   BMI 24.98 kg/m  General:  well developed, well nourished, in no apparent distress Skin:  no significant moles, warts, or growths Head:  no masses, lesions, or tenderness Eyes:  pupils equal and round, sclera anicteric without injection Ears:  canals without lesions, TMs shiny without retraction, no obvious effusion, no erythema Nose:  nares patent, mucosa normal, and no drainage Throat/Pharynx:  lips and gingiva without lesion; tongue and uvula midline; non-inflamed pharynx; no exudates or postnasal drainage Neck: neck supple without adenopathy, thyromegaly, or masses Lungs:  clear to auscultation, breath sounds equal bilaterally, no respiratory distress Cardio:  regular rate and rhythm, no LE edema Abdomen:  abdomen soft, nontender; bowel sounds normal; no masses or organomegaly Genital: Defer to GYN Musculoskeletal: Hypertonicity noted in the trapezius musculature and paraspinal musculature of the thoracic and lumbar region.  TTP as well.  Symmetrical muscle groups noted without atrophy or deformity Extremities:  no clubbing, cyanosis, or edema, no deformities, no skin discoloration Neuro:  gait normal; deep tendon reflexes normal and symmetric Psych: well oriented with normal range of affect and appropriate judgment/insight  Assessment and Plan  Well adult exam - Plan: CBC, Comprehensive metabolic panel with GFR, Lipid panel, IBC + Ferritin, Hemoglobin A1c, VITAMIN D  25 Hydroxy (Vit-D Deficiency, Fractures), Hepatitis B surface antibody,quantitative   Well 48 y.o. female. Counseled on  diet and exercise. Advanced directive form requested today.  Screen hep B. Stretches and exercises for her back provided.  Hypertonicity noted throughout her back.  Consider physical therapy versus trigger point injections.  Could consider duloxetine. Other orders as above. Follow up in 1 yr. The patient  voiced understanding and agreement to the plan.  Mabel [place], DO 07/20/24 9:26 AM

## 2024-07-20 NOTE — Patient Instructions (Addendum)
 Give us  2-3 business days to get the results of your labs back.   Keep the diet clean and stay active.  Heat (pad or rice pillow in microwave) over affected area, 10-15 minutes twice daily.   Ice/cold pack over area for 10-15 min twice daily.  OK to take Tylenol  1000 mg (2 extra strength tabs) or 975 mg (3 regular strength tabs) every 6 hours as needed.  A medicine to consider is duloxetine/Cymbalta to help with pain.   Foods that may reduce pain: 1) Ginger 2) Blueberries 3) Salmon 4) Pumpkin seeds 5) Dark chocolate 6) Turmeric 7) Tart cherries 8) Virgin olive oil 9) Chili peppers 10) Mint 11) Krill oil  Let us  know if you need anything.

## 2024-07-22 LAB — ALDOLASE

## 2024-07-22 LAB — HEPATITIS B SURFACE ANTIBODY, QUANTITATIVE: Hep B S AB Quant (Post): >1000 m[IU]/mL (ref >=10)

## 2024-08-09 ENCOUNTER — Encounter: Payer: Self-pay | Admitting: Radiology

## 2024-08-19 ENCOUNTER — Other Ambulatory Visit: Payer: Self-pay | Admitting: Family Medicine

## 2024-08-19 DIAGNOSIS — Z1231 Encounter for screening mammogram for malignant neoplasm of breast: Secondary | ICD-10-CM

## 2024-08-20 ENCOUNTER — Ambulatory Visit
Admission: RE | Admit: 2024-08-20 | Discharge: 2024-08-20 | Disposition: A | Discharge status: Home / Self Care | Source: Ambulatory Visit | Attending: Family Medicine | Admitting: Family Medicine

## 2024-08-20 DIAGNOSIS — Z1231 Encounter for screening mammogram for malignant neoplasm of breast: Secondary | ICD-10-CM

## 2024-08-27 ENCOUNTER — Ambulatory Visit

## 2024-08-31 ENCOUNTER — Encounter: Payer: Self-pay | Admitting: Dermatology

## 2024-08-31 ENCOUNTER — Ambulatory Visit (INDEPENDENT_AMBULATORY_CARE_PROVIDER_SITE_OTHER): Admitting: Dermatology

## 2024-08-31 VITALS — BP 105/73 | HR 66

## 2024-08-31 DIAGNOSIS — L811 Chloasma: Secondary | ICD-10-CM

## 2024-08-31 MED ORDER — SAFETY SEAL MISCELLANEOUS MISC: 1.0000 | Freq: Every day | 6 refills | Status: AC

## 2024-08-31 NOTE — Progress Notes (Signed)
   New Patient Visit  Patient (and/or pt guardian) consented to the use of AI-assisted tools for note generation.    Subjective  Vanessa Daugherty is a 48 y.o. female who presents for the following: Melasma  Located at the face that she would like to have examined.  Patient reports the areas have been there for 1 year.  Patient reports the areas have spread some  Patient reports she has previously been treated for these areas.  PCP prescribed Tri-Luma  (Fluocin-Hydroquinone -Tretinoin )  patient reports that when she uses it she does notice that it helps lighten some. Patient reports she has been using it for a year but does not use it consistently and only uses it once or twice a month. Patient reports she has not used any OTC creams that claim to help with hyperpigmentation. Patient reports that she does use sunscreen daily - Think Spot 50 SPF   The following portions of the chart were reviewed this encounter and updated as appropriate: medications, allergies, medical history  Review of Systems:  No other skin or systemic complaints except as noted in HPI or Assessment and Plan.  Objective  Well appearing patient in no apparent distress; mood and affect are within normal limits.  A focused examination was performed of the following areas: Face  Relevant exam findings are noted in the Assessment and Plan.          Assessment & Plan   MELASMA Exam: reticulated hyperpigmented patches at face  Melasma is a chronic; persistent condition of hyperpigmented patches generally on the face, worse in summer due to higher UV exposure.    Heredity; thyroid  disease; sun exposure; pregnancy; birth control pills; epilepsy medication and darker skin may predispose to Melasma.    Melasma present for one year, spreading with fluctuating darkness. Brown patches on bilateral cheeks and macules on the nose. Pigmentary demarcation lines likely triggered by hormonal changes during pregnancy.  Melasma is sensitive to sun exposure and requires consistent treatment. Triluma provides some relief but is not used regularly. Hydroquinone  in Triluma is effective but requires breaks to prevent exogenous ochronosis. Treatment is challenging and requires long-term commitment. Outcome depends on pigment depth and treatment consistency. Complete resolution is uncertain, but significant improvement is possible with consistent use.  - Prescribed 12% hydroquinone  with kojic acid for nighttime use for four months. - Recommended Eucerin Radiant Tone with thiamidol for morning use. - Advised use of mineral sunscreen for outdoor activities and daily sunscreen for sun protection. - Suggested salicylic acid cleanser for exfoliation. - Scheduled follow-up in four months to assess treatment efficacy.  MELASMA   Related Medications Safety Seal Miscellaneous MISC Apply 1 Application topically daily. Medication name: DeLightful Cream (Hydroquinone  12%%, Kojic Acid 2%, Vit C 2.5%, Tretinoin  0.05%, Hydrocortisone 1%, Hyaluronic Acid 0.1%)  Return in about 4 months (around 12/29/2024) for Melasma F/U.  I, Vanessa Daugherty, as acting as a neurosurgeon for [employer], DO .   Documentation: I have reviewed the above documentation for accuracy and completeness, and I agree with the above.  Vanessa Lenis, DO

## 2024-08-31 NOTE — Patient Instructions (Addendum)
 VISIT SUMMARY:  You came in today for a dermatological evaluation and management of melasma, which you have been experiencing for about a year. The condition is characterized by brown patches on your cheeks and some spots on your nose, and it tends to fluctuate in darkness. You have been using Triluma with some relief but not consistently.   YOUR PLAN:  -MELASMA:  Melasma is a skin condition that causes brown or gray-brown patches, usually on the face. It is often triggered by hormonal changes and sun exposure.   For your treatment, I have prescribed 12% hydroquinone  with kojic acid for nighttime use for four months. In the morning, you should use Eucerin Radiant Tone with thiamidol.   Additionally, use a mineral sunscreen for outdoor activities and daily sunscreen for general sun protection. A salicylic acid cleanser is recommended for exfoliation. Consistent use of these treatments is crucial for improvement.  INSTRUCTIONS:  Please follow up in four months to assess the efficacy of the treatment.   Recommended Mineral Sunscreen for when doing outside activities.    Your provider has sent your prescription to [facility] in [place], [place] . A pharmacy representative will call you to confirm details and take your payment information. If you do not receive a call within 24 hours, please contact the pharmacy at [phone] or [phone]. Your unique skincare compound is being formulated in our lab (most compounds take less than 24 hours). Your prescription is shipped vis USPS to your mailbox (2-4 business days). Priority shipping is available at an additional cost. Once received, you will electronically sign/acknowledge that you received your prescription. The pharmacy hours are Monday-Friday 9 am-6 pm EST and Saturday 9 am-1 pm EST.       Important Information  Due to recent changes in healthcare laws, you may see results of your pathology and/or laboratory studies on  MyChart before the doctors have had a chance to review them. We understand that in some cases there may be results that are confusing or concerning to you. Please understand that not all results are received at the same time and often the doctors may need to interpret multiple results in order to provide you with the best plan of care or course of treatment. Therefore, we ask that you please give us  2 business days to thoroughly review all your results before contacting the office for clarification. Should we see a critical lab result, you will be contacted sooner.   If You Need Anything After Your Visit  If you have any questions or concerns for your doctor, please call our main line at [phone] If no one answers, please leave a voicemail as directed and we will return your call as soon as possible. Messages left after 4 pm will be answered the following business day.   You may also send us  a message via MyChart. We typically respond to MyChart messages within 1-2 business days.  For prescription refills, please ask your pharmacy to contact our office. Our fax number is [phone].  If you have an urgent issue when the clinic is closed that cannot wait until the next business day, you can page your doctor at the number below.    Please note that while we do our best to be available for urgent issues outside of office hours, we are not available 24/7.   If you have an urgent issue and are unable to reach us , you may choose to seek medical care at your doctor's office, retail clinic, urgent care center,  or emergency room.  If you have a medical emergency, please immediately call 911 or go to the emergency department. In the event of inclement weather, please call our main line at [phone] for an update on the status of any delays or closures.  Dermatology Medication Tips: Please keep the boxes that topical medications come in in order to help keep track of the instructions about where  and how to use these. Pharmacies typically print the medication instructions only on the boxes and not directly on the medication tubes.   If your medication is too expensive, please contact our office at [phone] or send us  a message through MyChart.   We are unable to tell what your co-pay for medications will be in advance as this is different depending on your insurance coverage. However, we may be able to find a substitute medication at lower cost or fill out paperwork to get insurance to cover a needed medication.   If a prior authorization is required to get your medication covered by your insurance company, please allow us  1-2 business days to complete this process.  Drug prices often vary depending on where the prescription is filled and some pharmacies may offer cheaper prices.  The website www.goodrx.com contains coupons for medications through different pharmacies. The prices here do not account for what the cost may be with help from insurance (it may be cheaper with your insurance), but the website can give you the price if you did not use any insurance.  - You can print the associated coupon and take it with your prescription to the pharmacy.  - You may also stop by our office during regular business hours and pick up a GoodRx coupon card.  - If you need your prescription sent electronically to a different pharmacy, notify our office through [facility] or by phone at [phone]

## 2024-09-07 ENCOUNTER — Ambulatory Visit

## 2024-09-07 VITALS — BP 122/84 | HR 82 | Ht 63.0 in | Wt 141.1 lb

## 2024-09-07 DIAGNOSIS — N939 Abnormal uterine and vaginal bleeding, unspecified: Secondary | ICD-10-CM

## 2024-09-07 NOTE — Progress Notes (Unsigned)
   GYNECOLOGY PROBLEM OFFICE VISIT NOTE  History:  Vanessa Daugherty is a 48 y.o. [id number] here today for bleeding. She states she hasn't had a period for ~ 3 months and her last one was a lot with clots.  She reports her LMP was Aug 10, 2024.  She states it lasted pretty long... 9-10 days.  She states the first 3-4 days were heavy and things slowed down.  She states the clots were pretty big and compares them to medium sized strawberry.  She reports some cramping that describes as mild discomfort in my lower abdomen that did not require medication.   She reports some lower abdominal cramping during sex, but contributes to a retroverted uterus. She notes the pain improves with position change.   Reports feeling tired all the time, but denies dizziness, SOB, or syncope.       She denies any abnormal vaginal discharge, bleeding, pelvic pain or other concerns.   Past Medical History:  Diagnosis Date   Anemia    on meds   Seasonal allergies     Past Surgical History:  Procedure Laterality Date   NO PAST SURGERIES      The following portions of the patient's history were reviewed and updated as appropriate: allergies, current medications, past family history, past medical history, past social history, past surgical history and problem list.   Health Maintenance:  Patient's last menstrual period was 08/10/2024 (exact date). Normal pap ***.  Normal mammogram on ***.   Review of Systems:  Breasts: {pe breast exam:315056}. Genito-Urinary ROS: {rosgu:310671} Gastrointestinal ROS: {ros gi:310669} Objective:  Vitals: BP 122/84 (BP Location: Left Arm, Patient Position: Sitting, Cuff Size: Normal)   Pulse 82   Ht 5' 3 (1.6 m)   Wt 141 lb 1.3 oz (64 kg)   LMP 08/10/2024 (Exact Date)   BMI 24.99 kg/m   Physical Exam: OBGyn Exam   Labs and Imaging: MM 3D SCREENING MAMMOGRAM BILATERAL BREAST Result Date: 08/24/2024 CLINICAL DATA:  Screening. EXAM: DIGITAL SCREENING  BILATERAL MAMMOGRAM WITH TOMOSYNTHESIS AND CAD TECHNIQUE: Bilateral screening digital craniocaudal and mediolateral oblique mammograms were obtained. Bilateral screening digital breast tomosynthesis was performed. The images were evaluated with computer-aided detection. COMPARISON:  Previous exam(s). ACR Breast Density Category c: The breasts are heterogeneously dense, which may obscure small masses. FINDINGS: There are no findings suspicious for malignancy. IMPRESSION: No mammographic evidence of malignancy. A result letter of this screening mammogram will be mailed directly to the patient. RECOMMENDATION: Screening mammogram in one year. (Code:SM-B-01Y) BI-RADS CATEGORY  1: Negative. Electronically Signed   By: Dina  Arceo M.D.   On: 08/24/2024 08:44    Assessment & Plan:  *** year old ***   -*** -*** -***   Total face-to-face time with patient: {Blank single:19197::15,25,30} minutes   Synthia Raisin, [place] 09/07/2024 2:11 PM

## 2024-09-16 DIAGNOSIS — H524 Presbyopia: Secondary | ICD-10-CM | POA: Diagnosis not present

## 2024-12-30 ENCOUNTER — Ambulatory Visit: Admitting: Dermatology
